# Patient Record
Sex: Female | Born: 1975 | Race: Asian | Hispanic: No | Marital: Married | Smoking: Never smoker
Health system: Southern US, Community
[De-identification: ages and names within clinical notes are randomized; demographics above are authoritative.]

## PROBLEM LIST (undated history)

## (undated) DIAGNOSIS — I10 Essential (primary) hypertension: Secondary | ICD-10-CM

## (undated) DIAGNOSIS — F419 Anxiety disorder, unspecified: Secondary | ICD-10-CM

## (undated) DIAGNOSIS — F32A Depression, unspecified: Secondary | ICD-10-CM

## (undated) DIAGNOSIS — R32 Unspecified urinary incontinence: Secondary | ICD-10-CM

## (undated) DIAGNOSIS — J302 Other seasonal allergic rhinitis: Secondary | ICD-10-CM

## (undated) HISTORY — DX: Unspecified urinary incontinence: R32

## (undated) HISTORY — PX: NO PAST SURGERIES: SHX2092

## (undated) HISTORY — DX: Depression, unspecified: F32.A

## (undated) HISTORY — DX: Other seasonal allergic rhinitis: J30.2

## (undated) HISTORY — DX: Anxiety disorder, unspecified: F41.9

## (undated) HISTORY — DX: Essential (primary) hypertension: I10

---

## 2001-06-10 ENCOUNTER — Encounter: Admission: RE | Admit: 2001-06-10 | Discharge: 2001-07-10 | Payer: Self-pay | Admitting: *Deleted

## 2001-07-11 ENCOUNTER — Encounter: Admission: RE | Admit: 2001-07-11 | Discharge: 2001-08-10 | Payer: Self-pay | Admitting: Unknown Physician Specialty

## 2001-09-10 ENCOUNTER — Encounter: Admission: RE | Admit: 2001-09-10 | Discharge: 2001-10-10 | Payer: Self-pay | Admitting: Unknown Physician Specialty

## 2005-11-29 ENCOUNTER — Other Ambulatory Visit: Admission: RE | Admit: 2005-11-29 | Discharge: 2005-11-29 | Payer: Self-pay | Admitting: Obstetrics & Gynecology

## 2006-12-12 ENCOUNTER — Other Ambulatory Visit: Admission: RE | Admit: 2006-12-12 | Discharge: 2006-12-12 | Payer: Self-pay | Admitting: Obstetrics & Gynecology

## 2008-04-07 ENCOUNTER — Other Ambulatory Visit: Admission: RE | Admit: 2008-04-07 | Discharge: 2008-04-07 | Payer: Self-pay | Admitting: Obstetrics & Gynecology

## 2015-02-06 ENCOUNTER — Ambulatory Visit (INDEPENDENT_AMBULATORY_CARE_PROVIDER_SITE_OTHER): Payer: 59 | Admitting: Nurse Practitioner

## 2015-02-06 ENCOUNTER — Encounter: Payer: Self-pay | Admitting: Nurse Practitioner

## 2015-02-06 ENCOUNTER — Other Ambulatory Visit (HOSPITAL_COMMUNITY)
Admission: RE | Admit: 2015-02-06 | Discharge: 2015-02-06 | Disposition: A | Discharge status: Home / Self Care | Payer: 59 | Source: Ambulatory Visit | Attending: Nurse Practitioner | Admitting: Nurse Practitioner

## 2015-02-06 VITALS — BP 132/84 | HR 86 | Temp 98.1°F | Ht 62.0 in | Wt 173.0 lb

## 2015-02-06 DIAGNOSIS — Z6831 Body mass index (BMI) 31.0-31.9, adult: Secondary | ICD-10-CM

## 2015-02-06 DIAGNOSIS — Z0000 Encounter for general adult medical examination without abnormal findings: Secondary | ICD-10-CM | POA: Diagnosis not present

## 2015-02-06 DIAGNOSIS — Z1151 Encounter for screening for human papillomavirus (HPV): Secondary | ICD-10-CM | POA: Insufficient documentation

## 2015-02-06 DIAGNOSIS — Z1239 Encounter for other screening for malignant neoplasm of breast: Secondary | ICD-10-CM | POA: Diagnosis not present

## 2015-02-06 DIAGNOSIS — Z124 Encounter for screening for malignant neoplasm of cervix: Secondary | ICD-10-CM | POA: Diagnosis not present

## 2015-02-06 DIAGNOSIS — E669 Obesity, unspecified: Secondary | ICD-10-CM | POA: Insufficient documentation

## 2015-02-06 DIAGNOSIS — Z01419 Encounter for gynecological examination (general) (routine) without abnormal findings: Secondary | ICD-10-CM | POA: Insufficient documentation

## 2015-02-06 DIAGNOSIS — Z113 Encounter for screening for infections with a predominantly sexual mode of transmission: Secondary | ICD-10-CM | POA: Insufficient documentation

## 2015-02-06 LAB — COMPREHENSIVE METABOLIC PANEL
ALK PHOS: 62 U/L (ref 39–117)
ALT: 26 U/L (ref 0–35)
AST: 23 U/L (ref 0–37)
Albumin: 4.4 g/dL (ref 3.5–5.2)
BUN: 11 mg/dL (ref 6–23)
CO2: 26 mEq/L (ref 19–32)
CREATININE: 0.68 mg/dL (ref 0.40–1.20)
Calcium: 9.7 mg/dL (ref 8.4–10.5)
Chloride: 104 mEq/L (ref 96–112)
GFR: 101.3 mL/min (ref >60.00)
Glucose, Bld: 99 mg/dL (ref 70–99)
POTASSIUM: 4 meq/L (ref 3.5–5.1)
Sodium: 137 mEq/L (ref 135–145)
Total Bilirubin: 0.5 mg/dL (ref 0.2–1.2)
Total Protein: 8 g/dL (ref 6.0–8.3)

## 2015-02-06 LAB — CBC WITH DIFFERENTIAL/PLATELET
Basophils Absolute: 0.1 10*3/uL (ref 0.0–0.1)
Basophils Relative: 1 % (ref 0.0–3.0)
Eosinophils Absolute: 0.3 10*3/uL (ref 0.0–0.7)
Eosinophils Relative: 2.9 % (ref 0.0–5.0)
HCT: 38.9 % (ref 36.0–46.0)
HEMOGLOBIN: 12.9 g/dL (ref 12.0–15.0)
LYMPHS ABS: 2.7 10*3/uL (ref 0.7–4.0)
Lymphocytes Relative: 25.6 % (ref 12.0–46.0)
MCHC: 33.3 g/dL (ref 30.0–36.0)
MCV: 88.2 fl (ref 78.0–100.0)
MONO ABS: 0.6 10*3/uL (ref 0.1–1.0)
Monocytes Relative: 5.4 % (ref 3.0–12.0)
Neutro Abs: 6.8 10*3/uL (ref 1.4–7.7)
Neutrophils Relative %: 65.1 % (ref 43.0–77.0)
PLATELETS: 406 10*3/uL — AB (ref 150.0–400.0)
RBC: 4.41 Mil/uL (ref 3.87–5.11)
RDW: 14.3 % (ref 11.5–15.5)
WBC: 10.4 10*3/uL (ref 4.0–10.5)

## 2015-02-06 LAB — LIPID PANEL
CHOLESTEROL: 176 mg/dL (ref 0–200)
HDL: 66.6 mg/dL (ref >39.00)
LDL CALC: 92 mg/dL (ref 0–99)
NonHDL: 109.4
TRIGLYCERIDES: 88 mg/dL (ref 0.0–149.0)
Total CHOL/HDL Ratio: 3
VLDL: 17.6 mg/dL (ref 0.0–40.0)

## 2015-02-06 LAB — TSH: TSH: 4.03 u[IU]/mL (ref 0.35–4.50)

## 2015-02-06 LAB — URINALYSIS, ROUTINE W REFLEX MICROSCOPIC
Bilirubin Urine: NEGATIVE
Hgb urine dipstick: NEGATIVE
KETONES UR: NEGATIVE
Leukocytes, UA: NEGATIVE
Nitrite: NEGATIVE
PH: 6 (ref 5.0–8.0)
RBC / HPF: NONE SEEN (ref 0–?)
Specific Gravity, Urine: <=1.005 — AB (ref 1.000–1.030)
TOTAL PROTEIN, URINE-UPE24: NEGATIVE
URINE GLUCOSE: NEGATIVE
Urobilinogen, UA: 0.2 (ref 0.0–1.0)
WBC, UA: NONE SEEN (ref 0–?)

## 2015-02-06 LAB — VITAMIN D 25 HYDROXY (VIT D DEFICIENCY, FRACTURES): VITD: 15.2 ng/mL — AB (ref 30.00–100.00)

## 2015-02-06 LAB — HEMOGLOBIN A1C: HEMOGLOBIN A1C: 5.5 % (ref 4.6–6.5)

## 2015-02-06 NOTE — Progress Notes (Signed)
Pre visit review using our clinic review tool, if applicable. No additional management support is needed unless otherwise documented below in the visit note. 

## 2015-02-06 NOTE — Patient Instructions (Addendum)
Our office will call you with lab results and any necessary follow up.  Weight loss goal is about 35 pounds. Allow at least 4 months for weight to come off-1 to 2 pounds per week. Develop lifelong habits of exercise most days of the week: take a 30 minute walk. The benefits include weight loss, lower risk for heart disease, diabetes, stroke, high blood pressure, lower rates of depression & dementia, better sleep quality & bone health.   Cut out refined sugar:anything that is sweet when you eat or drink it except fresh fruit. Cut out white bread, rolls, biscuits, bagels, muffins, pasta and cereals. Breads & cereals that have 4 gm or more of fiber per serving are good. Whole wheat pasta, brown rice and quinoa are good.  Consider reading Eat to Live by Excell Seltzer and begin implementing principles.  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 Let me know if you want additional nutrition counseling or help.  Pleasure to meet you!  Preventive Care for Adults, Female A healthy lifestyle and preventive care can promote health and wellness. Preventive health guidelines for women include the following key practices.  A routine yearly physical is a good way to check with your caregiver about your health and preventive screening. It is a chance to share any concerns and updates on your health, and to receive a thorough exam.  Visit your dentist for a routine exam and preventive care every 6 months. Brush your teeth twice a day and floss once a day. Good oral hygiene prevents tooth decay and gum disease.  The frequency of eye exams is based on your age, health, family medical history, use of contact lenses, and other factors. Follow your caregiver's recommendations for frequency of eye exams.  Eat a healthy diet. Foods like vegetables, fruits, whole grains, low-fat dairy products, and lean protein foods contain the nutrients you need without too many calories. Decrease your intake of foods high in solid fats, added  sugars, and salt. Eat the right amount of calories for you.Get information about a proper diet from your caregiver, if necessary.  Regular physical exercise is one of the most important things you can do for your health. Most adults should get at least 150 minutes of moderate-intensity exercise (any activity that increases your heart rate and causes you to sweat) each week. In addition, most adults need muscle-strengthening exercises on 2 or more days a week.  Maintain a healthy weight. The body mass index (BMI) is a screening tool to identify possible weight problems. It provides an estimate of body fat based on height and weight. Your caregiver can help determine your BMI, and can help you achieve or maintain a healthy weight.For adults 20 years and older:  A BMI below 18.5 is considered underweight.  A BMI of 18.5 to 24.9 is normal.  A BMI of 25 to 29.9 is considered overweight.  A BMI of 30 and above is considered obese.  Maintain normal blood lipids and cholesterol levels by exercising and minimizing your intake of saturated fat. Eat a balanced diet with plenty of fruit and vegetables. Blood tests for lipids and cholesterol should begin at age 70 and be repeated every 5 years. If your lipid or cholesterol levels are high, you are over 50, or you are at high risk for heart disease, you may need your cholesterol levels checked more frequently.Ongoing high lipid and cholesterol levels should be treated with medicines if diet and exercise are not effective.  If you smoke, find out from  your caregiver how to quit. If you do not use tobacco, do not start.  Lung cancer screening is recommended for adults aged 44 80 years who are at high risk for developing lung cancer because of a history of smoking. Yearly low-dose computed tomography (CT) is recommended for people who have at least a 30-pack-year history of smoking and are a current smoker or have quit within the past 15 years. A pack year of  smoking is smoking an average of 1 pack of cigarettes a day for 1 year (for example: 1 pack a day for 30 years or 2 packs a day for 15 years). Yearly screening should continue until the smoker has stopped smoking for at least 15 years. Yearly screening should also be stopped for people who develop a health problem that would prevent them from having lung cancer treatment.  If you are pregnant, do not drink alcohol. If you are breastfeeding, be very cautious about drinking alcohol. If you are not pregnant and choose to drink alcohol, do not exceed 1 drink per day. One drink is considered to be 12 ounces (355 mL) of beer, 5 ounces (148 mL) of wine, or 1.5 ounces (44 mL) of liquor.  Avoid use of street drugs. Do not share needles with anyone. Ask for help if you need support or instructions about stopping the use of drugs.  High blood pressure causes heart disease and increases the risk of stroke. Your blood pressure should be checked at least every 1 to 2 years. Ongoing high blood pressure should be treated with medicines if weight loss and exercise are not effective.  If you are 17 to 41 years old, ask your caregiver if you should take aspirin to prevent strokes.  Diabetes screening involves taking a blood sample to check your fasting blood sugar level. This should be done once every 3 years, after age 27, if you are within normal weight and without risk factors for diabetes. Testing should be considered at a younger age or be carried out more frequently if you are overweight and have at least 1 risk factor for diabetes.  Breast cancer screening is essential preventive care for women. You should practice "breast self-awareness." This means understanding the normal appearance and feel of your breasts and may include breast self-examination. Any changes detected, no matter how small, should be reported to a caregiver. Women in their 7s and 30s should have a clinical breast exam (CBE) by a caregiver as part  of a regular health exam every 1 to 3 years. After age 38, women should have a CBE every year. Starting at age 23, women should consider having a mammography (breast X-ray test) every year. Women who have a family history of breast cancer should talk to their caregiver about genetic screening. Women at a high risk of breast cancer should talk to their caregivers about having magnetic resonance imaging (MRI) and a mammography every year.  Breast cancer gene (BRCA)-related cancer risk assessment is recommended for women who have family members with BRCA-related cancers. BRCA-related cancers include breast, ovarian, tubal, and peritoneal cancers. Having family members with these cancers may be associated with an increased risk for harmful changes (mutations) in the breast cancer genes BRCA1 and BRCA2. Results of the assessment will determine the need for genetic counseling and BRCA1 and BRCA2 testing.  The Pap test is a screening test for cervical cancer. A Pap test can show cell changes on the cervix that might become cervical cancer if left untreated. A  Pap test is a procedure in which cells are obtained and examined from the lower end of the uterus (cervix).  Women should have a Pap test starting at age 54.  Between ages 49 and 25, Pap tests should be repeated every 2 years.  Beginning at age 41, you should have a Pap test every 3 years as long as the past 3 Pap tests have been normal.  Some women have medical problems that increase the chance of getting cervical cancer. Talk to your caregiver about these problems. It is especially important to talk to your caregiver if a new problem develops soon after your last Pap test. In these cases, your caregiver may recommend more frequent screening and Pap tests.  The above recommendations are the same for women who have or have not gotten the vaccine for human papillomavirus (HPV).  If you had a hysterectomy for a problem that was not cancer or a condition  that could lead to cancer, then you no longer need Pap tests. Even if you no longer need a Pap test, a regular exam is a good idea to make sure no other problems are starting.  If you are between ages 54 and 39, and you have had normal Pap tests going back 10 years, you no longer need Pap tests. Even if you no longer need a Pap test, a regular exam is a good idea to make sure no other problems are starting.  If you have had past treatment for cervical cancer or a condition that could lead to cancer, you need Pap tests and screening for cancer for at least 20 years after your treatment.  If Pap tests have been discontinued, risk factors (such as a new sexual partner) need to be reassessed to determine if screening should be resumed.  The HPV test is an additional test that may be used for cervical cancer screening. The HPV test looks for the virus that can cause the cell changes on the cervix. The cells collected during the Pap test can be tested for HPV. The HPV test could be used to screen women aged 37 years and older, and should be used in women of any age who have unclear Pap test results. After the age of 74, women should have HPV testing at the same frequency as a Pap test.  Colorectal cancer can be detected and often prevented. Most routine colorectal cancer screening begins at the age of 58 and continues through age 68. However, your caregiver may recommend screening at an earlier age if you have risk factors for colon cancer. On a yearly basis, your caregiver may provide home test kits to check for hidden blood in the stool. Use of a small camera at the end of a tube, to directly examine the colon (sigmoidoscopy or colonoscopy), can detect the earliest forms of colorectal cancer. Talk to your caregiver about this at age 24, when routine screening begins. Direct examination of the colon should be repeated every 5 to 10 years through age 76, unless early forms of pre-cancerous polyps or small  growths are found.  Hepatitis C blood testing is recommended for all people born from [birth year] through 1967 and any individual with known risks for hepatitis C.  Practice safe sex. Use condoms and avoid high-risk sexual practices to reduce the spread of sexually transmitted infections (STIs). STIs include gonorrhea, chlamydia, syphilis, trichomonas, herpes, HPV, and human immunodeficiency virus (HIV). Herpes, HIV, and HPV are viral illnesses that have no cure. They can result  in disability, cancer, and death. Sexually active women aged 89 and younger should be checked for chlamydia. Older women with new or multiple partners should also be tested for chlamydia. Testing for other STIs is recommended if you are sexually active and at increased risk.  Osteoporosis is a disease in which the bones lose minerals and strength with aging. This can result in serious bone fractures. The risk of osteoporosis can be identified using a bone density scan. Women ages 56 and over and women at risk for fractures or osteoporosis should discuss screening with their caregivers. Ask your caregiver whether you should take a calcium supplement or vitamin D to reduce the rate of osteoporosis.  Menopause can be associated with physical symptoms and risks. Hormone replacement therapy is available to decrease symptoms and risks. You should talk to your caregiver about whether hormone replacement therapy is right for you.  Use sunscreen. Apply sunscreen liberally and repeatedly throughout the day. You should seek shade when your shadow is shorter than you. Protect yourself by wearing long sleeves, pants, a wide-brimmed hat, and sunglasses year round, whenever you are outdoors.  Once a month, do a whole body skin exam, using a mirror to look at the skin on your back. Notify your caregiver of new moles, moles that have irregular borders, moles that are larger than a pencil eraser, or moles that have changed in shape or color.  Stay  current with required immunizations.  Influenza vaccine. All adults should be immunized every year.  Tetanus, diphtheria, and acellular pertussis (Td, Tdap) vaccine. Pregnant women should receive 1 dose of Tdap vaccine during each pregnancy. The dose should be obtained regardless of the length of time since the last dose. Immunization is preferred during the 27th to 36th week of gestation. An adult who has not previously received Tdap or who does not know her vaccine status should receive 1 dose of Tdap. This initial dose should be followed by tetanus and diphtheria toxoids (Td) booster doses every 10 years. Adults with an unknown or incomplete history of completing a 3-dose immunization series with Td-containing vaccines should begin or complete a primary immunization series including a Tdap dose. Adults should receive a Td booster every 10 years.  Varicella vaccine. An adult without evidence of immunity to varicella should receive 2 doses or a second dose if she has previously received 1 dose. Pregnant females who do not have evidence of immunity should receive the first dose after pregnancy. This first dose should be obtained before leaving the health care facility. The second dose should be obtained 4 8 weeks after the first dose.  Human papillomavirus (HPV) vaccine. Females aged 71 26 years who have not received the vaccine previously should obtain the 3-dose series. The vaccine is not recommended for use in pregnant females. However, pregnancy testing is not needed before receiving a dose. If a female is found to be pregnant after receiving a dose, no treatment is needed. In that case, the remaining doses should be delayed until after the pregnancy. Immunization is recommended for any person with an immunocompromised condition through the age of 87 years if she did not get any or all doses earlier. During the 3-dose series, the second dose should be obtained 4 8 weeks after the first dose. The third  dose should be obtained 24 weeks after the first dose and 16 weeks after the second dose.  Zoster vaccine. One dose is recommended for adults aged 61 years or older unless certain conditions are  present.  Measles, mumps, and rubella (MMR) vaccine. Adults born before [birth year] generally are considered immune to measles and mumps. Adults born in [birth year] or later should have 1 or more doses of MMR vaccine unless there is a contraindication to the vaccine or there is laboratory evidence of immunity to each of the three diseases. A routine second dose of MMR vaccine should be obtained at least 28 days after the first dose for students attending postsecondary schools, health care workers, or international travelers. People who received inactivated measles vaccine or an unknown type of measles vaccine during 1963 1967 should receive 2 doses of MMR vaccine. People who received inactivated mumps vaccine or an unknown type of mumps vaccine before 1979 and are at high risk for mumps infection should consider immunization with 2 doses of MMR vaccine. For females of childbearing age, rubella immunity should be determined. If there is no evidence of immunity, females who are not pregnant should be vaccinated. If there is no evidence of immunity, females who are pregnant should delay immunization until after pregnancy. Unvaccinated health care workers born before [birth year] who lack laboratory evidence of measles, mumps, or rubella immunity or laboratory confirmation of disease should consider measles and mumps immunization with 2 doses of MMR vaccine or rubella immunization with 1 dose of MMR vaccine.  Pneumococcal 13-valent conjugate (PCV13) vaccine. When indicated, a person who is uncertain of her immunization history and has no record of immunization should receive the PCV13 vaccine. An adult aged 28 years or older who has certain medical conditions and has not been previously immunized should receive 1 dose of PCV13 vaccine. This  PCV13 should be followed with a dose of pneumococcal polysaccharide (PPSV23) vaccine. The PPSV23 vaccine dose should be obtained at least 8 weeks after the dose of PCV13 vaccine. An adult aged 62 years or older who has certain medical conditions and previously received 1 or more doses of PPSV23 vaccine should receive 1 dose of PCV13. The PCV13 vaccine dose should be obtained 1 or more years after the last PPSV23 vaccine dose.  Pneumococcal polysaccharide (PPSV23) vaccine. When PCV13 is also indicated, PCV13 should be obtained first. All adults aged 27 years and older should be immunized. An adult younger than age 39 years who has certain medical conditions should be immunized. Any person who resides in a nursing home or long-term care facility should be immunized. An adult smoker should be immunized. People with an immunocompromised condition and certain other conditions should receive both PCV13 and PPSV23 vaccines. People with human immunodeficiency virus (HIV) infection should be immunized as soon as possible after diagnosis. Immunization during chemotherapy or radiation therapy should be avoided. Routine use of PPSV23 vaccine is not recommended for American Indians, [place] Natives, or people younger than 65 years unless there are medical conditions that require PPSV23 vaccine. When indicated, people who have unknown immunization and have no record of immunization should receive PPSV23 vaccine. One-time revaccination 5 years after the first dose of PPSV23 is recommended for people aged 25 64 years who have chronic kidney failure, nephrotic syndrome, asplenia, or immunocompromised conditions. People who received 1 2 doses of PPSV23 before age 70 years should receive another dose of PPSV23 vaccine at age 3 years or later if at least 5 years have passed since the previous dose. Doses of PPSV23 are not needed for people immunized with PPSV23 at or after age 23 years.  Meningococcal vaccine. Adults with asplenia  or persistent complement component deficiencies should receive 2 doses of  quadrivalent meningococcal conjugate (MenACWY-D) vaccine. The doses should be obtained at least 2 months apart. Microbiologists working with certain meningococcal bacteria, [employer] recruits, people at risk during an outbreak, and people who travel to or live in countries with a high rate of meningitis should be immunized. A first-year college student up through age 27 years who is living in a residence hall should receive a dose if she did not receive a dose on or after her 16th birthday. Adults who have certain high-risk conditions should receive one or more doses of vaccine.  Hepatitis A vaccine. Adults who wish to be protected from this disease, have certain high-risk conditions, work with hepatitis A-infected animals, work in hepatitis A research labs, or travel to or work in countries with a high rate of hepatitis A should be immunized. Adults who were previously unvaccinated and who anticipate close contact with an international adoptee during the first 60 days after arrival in the [place] from a country with a high rate of hepatitis A should be immunized.  Hepatitis B vaccine. Adults who wish to be protected from this disease, have certain high-risk conditions, may be exposed to blood or other infectious body fluids, are household contacts or sex partners of hepatitis B positive people, are clients or workers in certain care facilities, or travel to or work in countries with a high rate of hepatitis B should be immunized.  Haemophilus influenzae type b (Hib) vaccine. A previously unvaccinated person with asplenia or sickle cell disease or having a scheduled splenectomy should receive 1 dose of Hib vaccine. Regardless of previous immunization, a recipient of a hematopoietic stem cell transplant should receive a 3-dose series 6 12 months after her successful transplant. Hib vaccine is not recommended for adults with HIV  infection. Preventive Services / Frequency Ages 49 to 58  Blood pressure check.** / Every 1 to 2 years.  Lipid and cholesterol check.** / Every 5 years beginning at age 2.  Clinical breast exam.** / Every 3 years for women in their 50s and 75s.  BRCA-related cancer risk assessment.** / For women who have family members with a BRCA-related cancer (breast, ovarian, tubal, or peritoneal cancers).  Pap test.** / Every 2 years from ages 62 through 52. Every 3 years starting at age 35 through age 46 or 77 with a history of 3 consecutive normal Pap tests.  HPV screening.** / Every 3 years from ages 31 through ages 58 to 38 with a history of 3 consecutive normal Pap tests.  Hepatitis C blood test.** / For any individual with known risks for hepatitis C.  Skin self-exam. / Monthly.  Influenza vaccine. / Every year.  Tetanus, diphtheria, and acellular pertussis (Tdap, Td) vaccine.** / Consult your caregiver. Pregnant women should receive 1 dose of Tdap vaccine during each pregnancy. 1 dose of Td every 10 years.  Varicella vaccine.** / Consult your caregiver. Pregnant females who do not have evidence of immunity should receive the first dose after pregnancy.  HPV vaccine. / 3 doses over 6 months, if 55 and younger. The vaccine is not recommended for use in pregnant females. However, pregnancy testing is not needed before receiving a dose.  Measles, mumps, rubella (MMR) vaccine.** / You need at least 1 dose of MMR if you were born in 1957 or later. You may also need a 2nd dose. For females of childbearing age, rubella immunity should be determined. If there is no evidence of immunity, females who are not pregnant should be vaccinated. If there  is no evidence of immunity, females who are pregnant should delay immunization until after pregnancy.  Pneumococcal 13-valent conjugate (PCV13) vaccine.** / Consult your caregiver.  Pneumococcal polysaccharide (PPSV23) vaccine.** / 1 to 2 doses if you  smoke cigarettes or if you have certain conditions.  Meningococcal vaccine.** / 1 dose if you are age 3 to 57 years and a Market researcher living in a residence hall, or have one of several medical conditions, you need to get vaccinated against meningococcal disease. You may also need additional booster doses.  Hepatitis A vaccine.** / Consult your caregiver.  Hepatitis B vaccine.** / Consult your caregiver.  Haemophilus influenzae type b (Hib) vaccine.** / Consult your caregiver. Ages 2 to 84  Blood pressure check.** / Every 1 to 2 years.  Lipid and cholesterol check.** / Every 5 years beginning at age 9.  Lung cancer screening. / Every year if you are aged 83 80 years and have a 30-pack-year history of smoking and currently smoke or have quit within the past 15 years. Yearly screening is stopped once you have quit smoking for at least 15 years or develop a health problem that would prevent you from having lung cancer treatment.  Clinical breast exam.** / Every year after age 64.  BRCA-related cancer risk assessment.** / For women who have family members with a BRCA-related cancer (breast, ovarian, tubal, or peritoneal cancers).  Mammogram.** / Every year beginning at age 26 and continuing for as long as you are in good health. Consult with your caregiver.  Pap test.** / Every 3 years starting at age 34 through age 28 or 87 with a history of 3 consecutive normal Pap tests.  HPV screening.** / Every 3 years from ages 81 through ages 68 to 33 with a history of 3 consecutive normal Pap tests.  Fecal occult blood test (FOBT) of stool. / Every year beginning at age 75 and continuing until age 65. You may not need to do this test if you get a colonoscopy every 10 years.  Flexible sigmoidoscopy or colonoscopy.** / Every 5 years for a flexible sigmoidoscopy or every 10 years for a colonoscopy beginning at age 56 and continuing until age 18.  Hepatitis C blood test.** / For all  people born from [birth year] through 1967 and any individual with known risks for hepatitis C.  Skin self-exam. / Monthly.  Influenza vaccine. / Every year.  Tetanus, diphtheria, and acellular pertussis (Tdap/Td) vaccine.** / Consult your caregiver. Pregnant women should receive 1 dose of Tdap vaccine during each pregnancy. 1 dose of Td every 10 years.  Varicella vaccine.** / Consult your caregiver. Pregnant females who do not have evidence of immunity should receive the first dose after pregnancy.  Zoster vaccine.** / 1 dose for adults aged 78 years or older.  Measles, mumps, rubella (MMR) vaccine.** / You need at least 1 dose of MMR if you were born in 1957 or later. You may also need a 2nd dose. For females of childbearing age, rubella immunity should be determined. If there is no evidence of immunity, females who are not pregnant should be vaccinated. If there is no evidence of immunity, females who are pregnant should delay immunization until after pregnancy.  Pneumococcal 13-valent conjugate (PCV13) vaccine.** / Consult your caregiver.  Pneumococcal polysaccharide (PPSV23) vaccine.** / 1 to 2 doses if you smoke cigarettes or if you have certain conditions.  Meningococcal vaccine.** / Consult your caregiver.  Hepatitis A vaccine.** / Consult your caregiver.  Hepatitis B vaccine.** /  Consult your caregiver.  Haemophilus influenzae type b (Hib) vaccine.** / Consult your caregiver. Ages 46 and over  Blood pressure check.** / Every 1 to 2 years.  Lipid and cholesterol check.** / Every 5 years beginning at age 17.  Lung cancer screening. / Every year if you are aged 74 80 years and have a 30-pack-year history of smoking and currently smoke or have quit within the past 15 years. Yearly screening is stopped once you have quit smoking for at least 15 years or develop a health problem that would prevent you from having lung cancer treatment.  Clinical breast exam.** / Every year after age  83.  BRCA-related cancer risk assessment.** / For women who have family members with a BRCA-related cancer (breast, ovarian, tubal, or peritoneal cancers).  Mammogram.** / Every year beginning at age 40 and continuing for as long as you are in good health. Consult with your caregiver.  Pap test.** / Every 3 years starting at age 32 through age 30 or 17 with a 3 consecutive normal Pap tests. Testing can be stopped between 65 and 70 with 3 consecutive normal Pap tests and no abnormal Pap or HPV tests in the past 10 years.  HPV screening.** / Every 3 years from ages 57 through ages 48 or 78 with a history of 3 consecutive normal Pap tests. Testing can be stopped between 65 and 70 with 3 consecutive normal Pap tests and no abnormal Pap or HPV tests in the past 10 years.  Fecal occult blood test (FOBT) of stool. / Every year beginning at age 73 and continuing until age 10. You may not need to do this test if you get a colonoscopy every 10 years.  Flexible sigmoidoscopy or colonoscopy.** / Every 5 years for a flexible sigmoidoscopy or every 10 years for a colonoscopy beginning at age 46 and continuing until age 60.  Hepatitis C blood test.** / For all people born from [birth year] through 1967 and any individual with known risks for hepatitis C.  Osteoporosis screening.** / A one-time screening for women ages 71 and over and women at risk for fractures or osteoporosis.  Skin self-exam. / Monthly.  Influenza vaccine. / Every year.  Tetanus, diphtheria, and acellular pertussis (Tdap/Td) vaccine.** / 1 dose of Td every 10 years.  Varicella vaccine.** / Consult your caregiver.  Zoster vaccine.** / 1 dose for adults aged 2 years or older.  Pneumococcal 13-valent conjugate (PCV13) vaccine.** / Consult your caregiver.  Pneumococcal polysaccharide (PPSV23) vaccine.** / 1 dose for all adults aged 70 years and older.  Meningococcal vaccine.** / Consult your caregiver.  Hepatitis A vaccine.** / Consult  your caregiver.  Hepatitis B vaccine.** / Consult your caregiver.  Haemophilus influenzae type b (Hib) vaccine.** / Consult your caregiver. ** Family history and personal history of risk and conditions may change your caregiver's recommendations. Document Released: 12/17/2001 Document Revised: 02/15/2013 Document Reviewed: 03/18/2011 [facility] Patient Information 2014 [place], [place].

## 2015-02-06 NOTE — Progress Notes (Signed)
Subjective:     Kayla Reeves is a 39 y.o. female and is here for a comprehensive physical exam. The patient reports no problems. Historical care at [facility] family physicians. Seen 4 yrs ago. She reports 35 lb wt gain over last year-"stress eating". Mother passed this year. She is working with Physiological scientist 3 days week. Last pap 2008. No abnmls. Never had MMG. Vaccines UTD.  History   Social History  . Marital Status: Married    Spouse Name: N/A  . Number of Children: 3  . Years of Education: [employer]   Occupational History  . [employer]    Social History Main Topics  . Smoking status: Never Smoker   . Smokeless tobacco: Not on file  . Alcohol Use: 0.6 oz/week    0 Standard drinks or equivalent, 1 Glasses of wine per week  . Drug Use: No  . Sexual Activity: Yes    Birth Control/ Protection: Surgical   Other Topics Concern  . Not on file   Social History Narrative   Kayla Reeves lives with husband & 3 kids. 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   Health Maintenance  Topic Date Due  . HIV Screening  01/06/1989  . PAP SMEAR  01/07/1992  . INFLUENZA VACCINE  06/05/2015  . TETANUS/TDAP  02/05/2017    The following portions of the patient's history were reviewed and updated as appropriate: allergies, current medications, past family history, past medical history, past social history, past surgical history and problem list.  Review of Systems Constitutional: negative for fatigue and fevers Eyes: positive for contacts/glasses Respiratory: negative for cough and wheezing Cardiovascular: negative for lower extremity edema and palpitations Gastrointestinal: negative for constipation, diarrhea and reflux symptoms Genitourinary:negative for abnormal menstrual periods Integument/breast: negative for rash Musculoskeletal:negative for arthralgias Allergic/Immunologic: positive for hay fever   Objective:    BP 132/84 mmHg  Pulse 86  Temp(Src) 98.1 F (36.7 C) (Oral)  Ht 5' 2"  (1.575 m)  Wt  173 lb (78.472 kg)  BMI 31.63 kg/m2  SpO2 99%  LMP 01/29/2015 General appearance: alert, cooperative, appears stated age and no distress Head: Normocephalic, without obvious abnormality, atraumatic Eyes: negative findings: lids and lashes normal, conjunctivae and sclerae normal, corneas clear, pupils equal, round, reactive to light and accomodation, visual fields full to confrontation and wearing glasses Ears: normal TM's and external ear canals both ears Throat: lips, mucosa, and tongue normal; teeth and gums normal Neck: no adenopathy, no carotid bruit, supple, symmetrical, trachea midline and thyroid not enlarged, symmetric, no tenderness/mass/nodules Lungs: clear to auscultation bilaterally Breasts: normal appearance, no masses or tenderness Heart: regular rate and rhythm, S1, S2 normal, no murmur, click, rub or gallop Abdomen: soft, non-tender; bowel sounds normal; no masses,  no organomegaly Pelvic: adnexae not palpable, cervix normal in appearance, external genitalia normal, no adnexal masses or tenderness, no cervical motion tenderness, uterus normal size, shape, and consistency and vagina normal without discharge Extremities: extremities normal, atraumatic, no cyanosis or edema Pulses: 2+ and symmetric Lymph nodes: Cervical, supraclavicular, and axillary nodes normal. Neurologic: Grossly normal    Assessment: Plan     1. Preventative health care - CBC with Differential/Platelet - Comprehensive metabolic panel - Vit D  25 hydroxy (rtn osteoporosis monitoring) - TSH - Hemoglobin A1c - Lipid panel - Urinalysis, Routine w reflex microscopic - HIV antibody  2. Cervical cancer screening - Cytology - PAP  3. Breast cancer screening - MM DIGITAL SCREENING BILATERAL; Future  4. BMI 31.0-31.9,adult Discussed increasing activity, diet changes  Continue wotking with trainer. Wt loss goal is 35 lbs. F/u 1-2 yrs, sooner if concerns or need additional help w/wt loss.

## 2015-02-07 LAB — HIV ANTIBODY (ROUTINE TESTING W REFLEX): HIV 1&2 Ab, 4th Generation: NONREACTIVE

## 2015-02-08 LAB — CYTOLOGY - PAP

## 2015-02-09 ENCOUNTER — Telehealth: Payer: Self-pay | Admitting: Nurse Practitioner

## 2015-02-09 DIAGNOSIS — R7989 Other specified abnormal findings of blood chemistry: Secondary | ICD-10-CM

## 2015-02-09 DIAGNOSIS — E559 Vitamin D deficiency, unspecified: Secondary | ICD-10-CM

## 2015-02-09 MED ORDER — VITAMIN D3 1.25 MG (50000 UT) PO CAPS: 1.0000 | ORAL_CAPSULE | ORAL | Status: DC

## 2015-02-09 NOTE — Telephone Encounter (Signed)
Called and informed patient of lab results. Lab appt made.

## 2015-02-09 NOTE — Telephone Encounter (Signed)
pls call pt: Advise Labs look good-just 2 concerns: vit d too low, sent script. Thyroid is borderline. i want to check again in 12 weeks. Pls schedule lab appt. Pap neg, HPV neg. Recommend pap again in 5 yrs.

## 2015-04-17 ENCOUNTER — Other Ambulatory Visit: Payer: 59

## 2015-07-05 ENCOUNTER — Encounter: Payer: Self-pay | Admitting: Family Medicine

## 2015-07-05 ENCOUNTER — Ambulatory Visit (INDEPENDENT_AMBULATORY_CARE_PROVIDER_SITE_OTHER): Payer: 59 | Admitting: Family Medicine

## 2015-07-05 ENCOUNTER — Telehealth: Payer: Self-pay | Admitting: Family Medicine

## 2015-07-05 VITALS — BP 154/98 | HR 88 | Temp 98.0°F | Wt 175.0 lb

## 2015-07-05 DIAGNOSIS — J0100 Acute maxillary sinusitis, unspecified: Secondary | ICD-10-CM | POA: Diagnosis not present

## 2015-07-05 MED ORDER — FLUTICASONE PROPIONATE 50 MCG/ACT NA SUSP: 2.0000 | Freq: Every day | NASAL | Status: DC

## 2015-07-05 MED ORDER — AMOXICILLIN-POT CLAVULANATE 875-125 MG PO TABS: 1.0000 | ORAL_TABLET | Freq: Two times a day (BID) | ORAL | Status: DC

## 2015-07-05 MED ORDER — GUAIFENESIN-DM 100-10 MG/5ML PO SYRP: 10.0000 mL | ORAL_SOLUTION | ORAL | Status: DC | PRN

## 2015-07-05 NOTE — Patient Instructions (Signed)

## 2015-07-05 NOTE — Progress Notes (Signed)
   Subjective:    Patient ID: Kayla Reeves, female    DOB: [birth year], 39 y.o.   MRN: [id number]  HPI  Sore throat: Pt reports to acute visit for sore throat, rhinorrhea that has been present for 2.5 weeks. Patient states her family was ill about 3 weeks with similar symptoms and she felt she was as well. She states, her symptoms started to improve when she wen ton vacation, but then worsened again over the last few days. She endorses productive cough (clear), unable to sleep d/t night time cough, headache, rhinorrhea, post nasal drip. She denies nausea, vomit, rash, diarrhea or congestion. She has tried ibuprofen for initial fever (2 weeks ago- no current fever or chills) and sore throat. She has also tried vis vapor rub and onion poultice.    Past Medical History  Diagnosis Date  . Seasonal allergies   . Urinary tract infection   . Allergy    No Known Allergies No past surgical history on file. Social History   Social History  . Marital Status: Married    Spouse Name: N/A  . Number of Children: 3  . Years of Education: [employer]   Occupational History  . [employer]    Social History Main Topics  . Smoking status: Never Smoker   . Smokeless tobacco: Not on file  . Alcohol Use: 0.6 oz/week    0 Standard drinks or equivalent, 1 Glasses of wine per week  . Drug Use: No  . Sexual Activity: Yes    Birth Control/ Protection: Surgical   Other Topics Concern  . Not on file   Social History Narrative   Ms Rhames lives with husband & 3 kids. 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   Review of Systems Negative, with the exception of above mentioned in HPI    Objective:   Physical Exam BP 154/98 mmHg  Pulse 88  Temp(Src) 98 F (36.7 C) (Oral)  Wt 175 lb (79.379 kg)  SpO2 99%  LMP 06/19/2015 Gen: Afebrile. No acute distress. Nontoxic in appearance. Appears fatigued. Moderate cough present during exam.  HENT: AT. [place]. Bilateral TM visualized and normal in appearance.  MMM. Bilateral nares with  erythema and swelling. Throat without erythema or exudates. Mild hoarseness on exam. Mild facial pressure.  Eyes:Pupils Equal Round Reactive to light,  Conjunctiva without redness, discharge or icterus. Neck: Supple,mild cervical anterior lymphadenopathy CV: RRR  Chest: CTAB, no wheeze or crackles. Good air movement Skin: No rashes, purpura or petechiae.      Assessment & Plan:  1. Acute maxillary sinusitis, recurrence not specified - signs/sx of sinusitis on exam today.  - amoxicillin-clavulanate (AUGMENTIN) 875-125 MG per tablet; Take 1 tablet by mouth 2 (two) times daily.  Dispense: 20 tablet; Refill: 0 - fluticasone (FLONASE) 50 MCG/ACT nasal spray; Place 2 sprays into both nostrils daily.  Dispense: 16 g; Refill: 6 - guaiFENesin-dextromethorphan (ROBITUSSIN DM) 100-10 MG/5ML syrup; Take 10 mLs by mouth every 4 (four) hours as needed for cough.  Dispense: 118 mL; Refill: 0 - Pt to return in not improved in 1 week. Discussed cough my linger even after illness has resolved for a few weeks.

## 2015-07-05 NOTE — Telephone Encounter (Signed)
[facility] called and said recent RX called in today for [place] is a failed rx? Please call at [phone] to verify.

## 2015-07-05 NOTE — Telephone Encounter (Signed)
Called Rx in for Robitussin DM as ordered at today's office visit.

## 2016-11-01 ENCOUNTER — Encounter: Payer: Self-pay | Admitting: Family Medicine

## 2016-11-01 ENCOUNTER — Ambulatory Visit (INDEPENDENT_AMBULATORY_CARE_PROVIDER_SITE_OTHER): Payer: 59 | Admitting: Family Medicine

## 2016-11-01 VITALS — BP 123/86 | HR 94 | Temp 98.4°F | Resp 20 | Ht 62.0 in | Wt 167.8 lb

## 2016-11-01 DIAGNOSIS — Z0000 Encounter for general adult medical examination without abnormal findings: Secondary | ICD-10-CM

## 2016-11-01 DIAGNOSIS — E559 Vitamin D deficiency, unspecified: Secondary | ICD-10-CM | POA: Diagnosis not present

## 2016-11-01 DIAGNOSIS — E669 Obesity, unspecified: Secondary | ICD-10-CM | POA: Diagnosis not present

## 2016-11-01 DIAGNOSIS — Z0189 Encounter for other specified special examinations: Secondary | ICD-10-CM

## 2016-11-01 DIAGNOSIS — Z1239 Encounter for other screening for malignant neoplasm of breast: Secondary | ICD-10-CM

## 2016-11-01 DIAGNOSIS — L821 Other seborrheic keratosis: Secondary | ICD-10-CM

## 2016-11-01 DIAGNOSIS — Z6830 Body mass index (BMI) 30.0-30.9, adult: Secondary | ICD-10-CM

## 2016-11-01 DIAGNOSIS — Z130 Encounter for screening for diseases of the blood and blood-forming organs and certain disorders involving the immune mechanism: Secondary | ICD-10-CM

## 2016-11-01 DIAGNOSIS — Z1231 Encounter for screening mammogram for malignant neoplasm of breast: Secondary | ICD-10-CM

## 2016-11-01 LAB — LIPID PANEL
CHOLESTEROL: 156 mg/dL (ref 0–200)
HDL: 62.8 mg/dL (ref >39.00)
LDL Cholesterol: 76 mg/dL (ref 0–99)
NonHDL: 93.57
TRIGLYCERIDES: 88 mg/dL (ref 0.0–149.0)
Total CHOL/HDL Ratio: 2
VLDL: 17.6 mg/dL (ref 0.0–40.0)

## 2016-11-01 LAB — COMPREHENSIVE METABOLIC PANEL
ALBUMIN: 4.2 g/dL (ref 3.5–5.2)
ALT: 17 U/L (ref 0–35)
AST: 16 U/L (ref 0–37)
Alkaline Phosphatase: 56 U/L (ref 39–117)
BILIRUBIN TOTAL: 0.6 mg/dL (ref 0.2–1.2)
BUN: 13 mg/dL (ref 6–23)
CALCIUM: 9 mg/dL (ref 8.4–10.5)
CO2: 28 meq/L (ref 19–32)
CREATININE: 0.69 mg/dL (ref 0.40–1.20)
Chloride: 102 mEq/L (ref 96–112)
GFR: 98.78 mL/min (ref >60.00)
Glucose, Bld: 95 mg/dL (ref 70–99)
Potassium: 4.1 mEq/L (ref 3.5–5.1)
SODIUM: 138 meq/L (ref 135–145)
Total Protein: 7.3 g/dL (ref 6.0–8.3)

## 2016-11-01 LAB — CBC WITH DIFFERENTIAL/PLATELET
BASOS ABS: 0 10*3/uL (ref 0.0–0.1)
Basophils Relative: 0.5 % (ref 0.0–3.0)
Eosinophils Absolute: 0.3 10*3/uL (ref 0.0–0.7)
Eosinophils Relative: 3.1 % (ref 0.0–5.0)
HEMATOCRIT: 36.7 % (ref 36.0–46.0)
Hemoglobin: 12.4 g/dL (ref 12.0–15.0)
LYMPHS PCT: 19.6 % (ref 12.0–46.0)
Lymphs Abs: 2 10*3/uL (ref 0.7–4.0)
MCHC: 33.8 g/dL (ref 30.0–36.0)
MCV: 87.6 fl (ref 78.0–100.0)
MONO ABS: 0.7 10*3/uL (ref 0.1–1.0)
Monocytes Relative: 6.6 % (ref 3.0–12.0)
Neutro Abs: 7 10*3/uL (ref 1.4–7.7)
Neutrophils Relative %: 70.2 % (ref 43.0–77.0)
PLATELETS: 353 10*3/uL (ref 150.0–400.0)
RBC: 4.2 Mil/uL (ref 3.87–5.11)
RDW: 14.5 % (ref 11.5–15.5)
WBC: 10 10*3/uL (ref 4.0–10.5)

## 2016-11-01 LAB — TSH: TSH: 3.63 u[IU]/mL (ref 0.35–4.50)

## 2016-11-01 LAB — HEMOGLOBIN A1C: Hgb A1c MFr Bld: 5.2 % (ref 4.6–6.5)

## 2016-11-01 LAB — VITAMIN D 25 HYDROXY (VIT D DEFICIENCY, FRACTURES): VITD: 18.78 ng/mL — ABNORMAL LOW (ref 30.00–100.00)

## 2016-11-01 NOTE — Patient Instructions (Signed)
It was a pleasure seeing you again.   Please help [place] help you:  It is a privilege to be able to take care of great patients such as yourself. We are honored you have chosen [facility] for your Primary Care home. Below you will find basic instructions that you may need to access in the future. Please help [place] help you by reading the instructions, which cover many of the frequent questions we experience.   Prescription refills and request:  -In order to allow more efficient response time, please call your pharmacy for all refills. They will forward the request electronically to [place]. This allows for the quickest possible response. Request left on a nurse line can take longer to refill, since these are checked as time allows between office patients and other phone calls.  - refill request can take up to 3-5 working days to complete.  - If request is sent electronically and request is appropiate, it is usually completed in 1-2 business days.  - all patients will need to be seen routinely for all chronic medical conditions requiring prescription medications (see follow-up below). If you are overdue for follow up on your condition, you will be asked to make an appointment and we will call in enough medication to cover you until your appointment (up to 30 days).  - all controlled substances will require a face to face visit to request/refill.  - if you desire your prescriptions to go through a new pharmacy, and have an active script at original pharmacy, you will need to call your pharmacy and have scripts transferred to new pharmacy. This is completed between the pharmacy locations and not by your provider.    Results: If any images or labs were ordered, it can take up to 1 week to get results depending on the test ordered and the lab/facility running and resulting the test. - Normal or stable results, which do not need further discussion, will be released to your mychart immediately with attached note  to you. A call will not be generated for normal results. Please make certain to sign up for mychart. If you have questions on how to activate your mychart you can call the front office.  - If your results need further discussion, our office will attempt to contact you via phone, and if unable to reach you after 2 attempts, we will release your abnormal result to your mychart with instructions.  - All results will be automatically released in mychart after 1 week.  - Your provider will provide you with explanation and instruction on all relevant material in your results. Please keep in mind, results and labs may appear confusing or abnormal to the untrained eye, but it does not mean they are actually abnormal for you personally. If you have any questions about your results that are not covered, or you desire more detailed explanation than what was provided, you should make an appointment with your provider to do so.   Our office handles many outgoing and incoming calls daily. If we have not contacted you within 1 week about your results, please check your mychart to see if there is a message first and if not, then contact our office.  In helping with this matter, you help decrease call volume, and therefore allow [place] to be able to respond to patients needs more efficiently.   Acute office visits (sick visit):  An acute visit is intended for a new problem and are scheduled in shorter time slots  to allow schedule openings for patients with new problems. This is the appropriate visit to discuss a new problem. In order to provide you with excellent quality medical care with proper time for you to explain your problem, have an exam and receive treatment with instructions, these appointments should be limited to one new problem per visit. If you experience a new problem, in which you desire to be addressed, please make an acute office visit, we save openings on the schedule to accommodate you. Please do not save  your new problem for any other type of visit, let us take care of it properly and quickly for you.   Follow up visits:  Depending on your condition(s) your provider will need to see you routinely in order to provide you with quality care and prescribe medication(s). Most chronic conditions (Example: hypertension, Diabetes, depression/anxiety... etc), require visits a couple times a year. Your provider will instruct you on proper follow up for your personal medical conditions and history. Please make certain to make follow up appointments for your condition as instructed. Failing to do so could result in lapse in your medication treatment/refills. If you request a refill, and are overdue to be seen on a condition, we will always provide you with a 30 day script (once) to allow you time to schedule.    Medicare wellness (well visit): - we have a wonderful Nurse Maudie Mercury), that will meet with you and provide you will yearly medicare wellness visits. These visits should occur yearly (can not be scheduled less than 1 calendar year apart) and cover preventive health, immunizations, advance directives and screenings you are entitled to yearly through your medicare benefits. Do not miss out on your entitled benefits, this is when medicare will pay for these benefits to be ordered for you.  These are strongly encouraged by your provider and is the appropriate type of visit to make certain you are up to date with all preventive health benefits. If you have not had your medicare wellness exam in the last 12 months, please make certain to schedule one by calling the office and schedule your medicare wellness with Maudie Mercury as soon as possible.   Yearly physical (well visit):  - Adults are recommended to be seen yearly for physicals. Check with your insurance and date of your last physical, most insurances require one calendar year between physicals. Physicals include all preventive health topics, screenings, medical exam and  labs that are appropriate for gender/age and history. You may have fasting labs needed at this visit. This is a well visit (not a sick visit), acute topics should not be covered during this visit.  - Pediatric patients are seen more frequently when they are younger. Your provider will advise you on well child visit timing that is appropriate for your their age. - This is not a medicare wellness visit. Medicare wellness exams do not have an exam portion to the visit. Some medicare companies allow for a physical, some do not allow a yearly physical. If your medicare allows a yearly physical you can schedule the medicare wellness with our nurse Maudie Mercury and have your physical with your provider after, on the same day. Please check with insurance for your full benefits.   Late Policy/No Shows:  - all new patients should arrive 15-30 minutes earlier than appointment to allow [place] time  to  obtain all personal demographics,  insurance information and for you to complete office paperwork. - All established patients should arrive 10-15 minutes earlier than  appointment time to update all information and be checked in .  - In our best efforts to run on time, if you are late for your appointment you will be asked to either reschedule or if able, we will work you back into the schedule. There will be a wait time to work you back in the schedule,  depending on availability.  - If you are unable to make it to your appointment as scheduled, please call 24 hours ahead of time to allow [place] to fill the time slot with someone else who needs to be seen. If you do not cancel your appointment ahead of time, you may be charged a no show fee.     Health Maintenance, Female Introduction Adopting a healthy lifestyle and getting preventive care can go a long way to promote health and wellness. Talk with your health care provider about what schedule of regular examinations is right for you. This is a good chance for you to check in with  your provider about disease prevention and staying healthy. In between checkups, there are plenty of things you can do on your own. Experts have done a lot of research about which lifestyle changes and preventive measures are most likely to keep you healthy. Ask your health care provider for more information. Weight and diet Eat a healthy diet  Be sure to include plenty of vegetables, fruits, low-fat dairy products, and lean protein.  Do not eat a lot of foods high in solid fats, added sugars, or salt.  Get regular exercise. This is one of the most important things you can do for your health.  Most adults should exercise for at least 150 minutes each week. The exercise should increase your heart rate and make you sweat (moderate-intensity exercise).  Most adults should also do strengthening exercises at least twice a week. This is in addition to the moderate-intensity exercise. Maintain a healthy weight  Body mass index (BMI) is a measurement that can be used to identify possible weight problems. It estimates body fat based on height and weight. Your health care provider can help determine your BMI and help you achieve or maintain a healthy weight.  For females 10 years of age and older:  A BMI below 18.5 is considered underweight.  A BMI of 18.5 to 24.9 is normal.  A BMI of 25 to 29.9 is considered overweight.  A BMI of 30 and above is considered obese. Watch levels of cholesterol and blood lipids  You should start having your blood tested for lipids and cholesterol at 42 years of age, then have this test every 5 years.  You may need to have your cholesterol levels checked more often if:  Your lipid or cholesterol levels are high.  You are older than 42 years of age.  You are at high risk for heart disease. Cancer screening Lung Cancer  Lung cancer screening is recommended for adults 49-22 years old who are at high risk for lung cancer because of a history of smoking.  A  yearly low-dose CT scan of the lungs is recommended for people who:  Currently smoke.  Have quit within the past 15 years.  Have at least a 30-pack-year history of smoking. A pack year is smoking an average of one pack of cigarettes a day for 1 year.  Yearly screening should continue until it has been 15 years since you quit.  Yearly screening should stop if you develop a health problem that would prevent you from  having lung cancer treatment. Breast Cancer  Practice breast self-awareness. This means understanding how your breasts normally appear and feel.  It also means doing regular breast self-exams. Let your health care provider know about any changes, no matter how small.  If you are in your 20s or 30s, you should have a clinical breast exam (CBE) by a health care provider every 1-3 years as part of a regular health exam.  If you are 41 or older, have a CBE every year. Also consider having a breast X-ray (mammogram) every year.  If you have a family history of breast cancer, talk to your health care provider about genetic screening.  If you are at high risk for breast cancer, talk to your health care provider about having an MRI and a mammogram every year.  Breast cancer gene (BRCA) assessment is recommended for women who have family members with BRCA-related cancers. BRCA-related cancers include:  Breast.  Ovarian.  Tubal.  Peritoneal cancers.  Results of the assessment will determine the need for genetic counseling and BRCA1 and BRCA2 testing. Cervical Cancer  Your health care provider may recommend that you be screened regularly for cancer of the pelvic organs (ovaries, uterus, and vagina). This screening involves a pelvic examination, including checking for microscopic changes to the surface of your cervix (Pap test). You may be encouraged to have this screening done every 3 years, beginning at age 10.  For women ages 32-65, health care providers may recommend pelvic  exams and Pap testing every 3 years, or they may recommend the Pap and pelvic exam, combined with testing for human papilloma virus (HPV), every 5 years. Some types of HPV increase your risk of cervical cancer. Testing for HPV may also be done on women of any age with unclear Pap test results.  Other health care providers may not recommend any screening for nonpregnant women who are considered low risk for pelvic cancer and who do not have symptoms. Ask your health care provider if a screening pelvic exam is right for you.  If you have had past treatment for cervical cancer or a condition that could lead to cancer, you need Pap tests and screening for cancer for at least 20 years after your treatment. If Pap tests have been discontinued, your risk factors (such as having a new sexual partner) need to be reassessed to determine if screening should resume. Some women have medical problems that increase the chance of getting cervical cancer. In these cases, your health care provider may recommend more frequent screening and Pap tests. Colorectal Cancer  This type of cancer can be detected and often prevented.  Routine colorectal cancer screening usually begins at 42 years of age and continues through 42 years of age.  Your health care provider may recommend screening at an earlier age if you have risk factors for colon cancer.  Your health care provider may also recommend using home test kits to check for hidden blood in the stool.  A small camera at the end of a tube can be used to examine your colon directly (sigmoidoscopy or colonoscopy). This is done to check for the earliest forms of colorectal cancer.  Routine screening usually begins at age 61.  Direct examination of the colon should be repeated every 5-10 years through 42 years of age. However, you may need to be screened more often if early forms of precancerous polyps or small growths are found. Skin Cancer  Check your skin from head to  toe regularly.  Tell your health care provider about any new moles or changes in moles, especially if there is a change in a mole's shape or color.  Also tell your health care provider if you have a mole that is larger than the size of a pencil eraser.  Always use sunscreen. Apply sunscreen liberally and repeatedly throughout the day.  Protect yourself by wearing long sleeves, pants, a wide-brimmed hat, and sunglasses whenever you are outside. Heart disease, diabetes, and high blood pressure  High blood pressure causes heart disease and increases the risk of stroke. High blood pressure is more likely to develop in:  People who have blood pressure in the high end of the normal range (130-139/85-89 mm Hg).  People who are overweight or obese.  People who are African American.  If you are 51-65 years of age, have your blood pressure checked every 3-5 years. If you are 74 years of age or older, have your blood pressure checked every year. You should have your blood pressure measured twice-once when you are at a hospital or clinic, and once when you are not at a hospital or clinic. Record the average of the two measurements. To check your blood pressure when you are not at a hospital or clinic, you can use:  An automated blood pressure machine at a pharmacy.  A home blood pressure monitor.  If you are between 38 years and 36 years old, ask your health care provider if you should take aspirin to prevent strokes.  Have regular diabetes screenings. This involves taking a blood sample to check your fasting blood sugar level.  If you are at a normal weight and have a low risk for diabetes, have this test once every three years after 42 years of age.  If you are overweight and have a high risk for diabetes, consider being tested at a younger age or more often. Preventing infection Hepatitis B  If you have a higher risk for hepatitis B, you should be screened for this virus. You are considered  at high risk for hepatitis B if:  You were born in a country where hepatitis B is common. Ask your health care provider which countries are considered high risk.  Your parents were born in a high-risk country, and you have not been immunized against hepatitis B (hepatitis B vaccine).  You have HIV or AIDS.  You use needles to inject street drugs.  You live with someone who has hepatitis B.  You have had sex with someone who has hepatitis B.  You get hemodialysis treatment.  You take certain medicines for conditions, including cancer, organ transplantation, and autoimmune conditions. Hepatitis C  Blood testing is recommended for:  Everyone born from [birth year] through 1967.  Anyone with known risk factors for hepatitis C. Sexually transmitted infections (STIs)  You should be screened for sexually transmitted infections (STIs) including gonorrhea and chlamydia if:  You are sexually active and are younger than 42 years of age.  You are older than 42 years of age and your health care provider tells you that you are at risk for this type of infection.  Your sexual activity has changed since you were last screened and you are at an increased risk for chlamydia or gonorrhea. Ask your health care provider if you are at risk.  If you do not have HIV, but are at risk, it may be recommended that you take a prescription medicine daily to prevent HIV infection. This is called pre-exposure prophylaxis (PrEP). You  are considered at risk if:  You are sexually active and do not regularly use condoms or know the HIV status of your partner(s).  You take drugs by injection.  You are sexually active with a partner who has HIV. Talk with your health care provider about whether you are at high risk of being infected with HIV. If you choose to begin PrEP, you should first be tested for HIV. You should then be tested every 3 months for as long as you are taking PrEP. Pregnancy  If you are premenopausal  and you may become pregnant, ask your health care provider about preconception counseling.  If you may become pregnant, take 400 to 800 micrograms (mcg) of folic acid every day.  If you want to prevent pregnancy, talk to your health care provider about birth control (contraception). Osteoporosis and menopause  Osteoporosis is a disease in which the bones lose minerals and strength with aging. This can result in serious bone fractures. Your risk for osteoporosis can be identified using a bone density scan.  If you are 58 years of age or older, or if you are at risk for osteoporosis and fractures, ask your health care provider if you should be screened.  Ask your health care provider whether you should take a calcium or vitamin D supplement to lower your risk for osteoporosis.  Menopause may have certain physical symptoms and risks.  Hormone replacement therapy may reduce some of these symptoms and risks. Talk to your health care provider about whether hormone replacement therapy is right for you. Follow these instructions at home:  Schedule regular health, dental, and eye exams.  Stay current with your immunizations.  Do not use any tobacco products including cigarettes, chewing tobacco, or electronic cigarettes.  If you are pregnant, do not drink alcohol.  If you are breastfeeding, limit how much and how often you drink alcohol.  Limit alcohol intake to no more than 1 drink per day for nonpregnant women. One drink equals 12 ounces of beer, 5 ounces of wine, or 1 ounces of hard liquor.  Do not use street drugs.  Do not share needles.  Ask your health care provider for help if you need support or information about quitting drugs.  Tell your health care provider if you often feel depressed.  Tell your health care provider if you have ever been abused or do not feel safe at home. This information is not intended to replace advice given to you by your health care provider. Make  sure you discuss any questions you have with your health care provider. Document Released: 05/06/2011 Document Revised: 03/28/2016 Document Reviewed: 07/25/2015  2017 [employer]

## 2016-11-01 NOTE — Progress Notes (Signed)
   Patient ID: Kayla Reeves, female  DOB: 03/14/1976, 40 y.o.   MRN: [id number] Patient Care Team    Relationship Specialty Notifications Start End  Renee A Kuneff, DO PCP - General Family Medicine  10/10/16   Renee A Kuneff, DO PCP - Family Medicine Family Medicine  07/05/15     Subjective:  Kayla Reeves is a 40 y.o.  Female  present for CPE. All past medical history, surgical history, allergies, family history, immunizations, medications and social history were updated in the electronic medical record today. All recent labs, ED visits and hospitalizations within the last year were reviewed.  Well women: Pt reports unchanged cycles, occurring every ~30 days, last ~ 5 days. She denies dyspareunia, vaginal discharge/irritation or urinary complaints. She performs SBE. She has never had a mammogram, her half sister has had breast cancer.  She performs SBE. PAP UTD, Due 2019.   Skin lesion: small lesion left forearm, present for about 2-3 months. No prior h/o skin cancer.   Health maintenance:  Colonoscopy: No Fhx, screen at 48 Mammogram: completed:Never. FHX breast cancer in half sister.  Cervical cancer screening: last pap 02/2015:, results: normal, neg HPV, completed by: PCP Immunizations: tdap 02/2007, Influenza declined (encouraged yearly) Infectious disease screening: HIV completed DEXA: N/A Assistive device: none Oxygen use:none Patient has a Dental home. Hospitalizations/ED visits: None  Depression screen PHQ 2/9 11/01/2016  Decreased Interest 0  Down, Depressed, Hopeless 0  PHQ - 2 Score 0   Current Exercise Habits: Structured exercise class, Type of exercise: yoga;walking, Time (Minutes): 60, Frequency (Times/Week): 4, Weekly Exercise (Minutes/Week): 240, Intensity: Moderate    Immunization History  Administered Date(s) Administered  . Tdap 11/04/2006     Past Medical History:  Diagnosis Date  . Allergy   . Seasonal allergies    No Known Allergies Past Surgical  History:  Procedure Laterality Date  . NO PAST SURGERIES     Family History  Problem Relation Age of Onset  . Diabetes Father   . Aneurysm Father     brain  . Arthritis Mother     rheumatoid  . Stroke Mother   . High blood pressure Mother   . Breast cancer Sister     breast  . COPD Maternal Grandmother   . Stroke Paternal Grandmother   . Heart disease Paternal Grandfather    Social History   Social History  . Marital status: Married    Spouse name: N/A  . Number of children: 3  . Years of education: [employer]   Occupational History  . SAHM    Social History Main Topics  . Smoking status: Never Smoker  . Smokeless tobacco: Never Used  . Alcohol use 0.6 oz/week    1 Glasses of wine per week  . Drug use: No  . Sexual activity: Yes    Partners: Male    Birth control/ protection: Surgical   Other Topics Concern  . Not on file   Social History Narrative   Kayla Reeves lives with husband & 3 kids. 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   Allergies as of 11/01/2016   No Known Allergies     Medication List    as of 11/01/2016 12:16 PM   You have not been prescribed any medications.      No results found for this or any previous visit (from the past 2160 hour(s)).  No results found.   ROS: 14 pt review of systems performed and negative (unless mentioned in an HPI)    Objective: BP 123/86 (BP Location: Right Arm, Patient Position: Sitting, Cuff Size: Normal)   Pulse 94   Temp 98.4 F (36.9 C)   Resp 20   Ht 5' 2" (1.575 m)   Wt 167 lb 12 oz (76.1 kg)   LMP 10/03/2016   SpO2 99%   BMI 30.68 kg/m  Gen: Afebrile. No acute distress. Nontoxic in appearance, well-developed, well-nourished,  Overweight, pleasant female.  HENT: AT. [address]. Bilateral TM visualized and normal in appearance, normal external auditory canal. MMM, no oral lesions, adequate dentition. Bilateral nares within normal limits. Throat without erythema, ulcerations or exudates. no Cough on exam, no  hoarseness on exam. Eyes:Pupils Equal Round Reactive to light, Extraocular movements intact,  Conjunctiva without redness, discharge or icterus. Neck/lymp/endocrine: Supple,no lymphadenopathy, no thyromegaly CV: RRR no murmur, no edema, +2/4 P posterior tibialis pulses. no carotid bruits. No JVD. Chest: CTAB, no wheeze, rhonchi or crackles. Normal  Respiratory effort. good Air movement. Abd: Soft. flat. NTND. BS present. no Masses palpated. No hepatosplenomegaly. No rebound tenderness or guarding. Skin: no rashes, purpura or petechiae. Warm and well-perfused. Skin intact. Neuro/Msk:  Normal gait. PERLA. EOMi. Alert. Oriented x3.  Cranial nerves II through XII intact. Muscle strength 5/5 upper/lower extremity. DTRs equal bilaterally. Psych: Normal affect, dress and demeanor. Normal speech. Normal thought content and judgment. Skin lesion   Assessment/plan: [name] is a 40 y.o. female present for CPE. Preventative health care Patient was encouraged to exercise greater than 150 minutes a week. Patient was encouraged to choose a diet filled with fresh fruits and vegetables, and lean meats. AVS provided to patient today for education/recommendation on gender specific health and safety maintenance Colonoscopy: No Fhx, screen at 50 Mammogram: completed:Never. FHX breast cancer in half sister. Ordered today at Breast center.   Cervical cancer screening: last pap 02/2015:, results: normal, neg HPV, completed by: PCP Immunizations: tdap 02/2007, Influenza declined (encouraged yearly) Infectious disease screening: HIV completed DEXA: N/A BMI 30.0-30.9,adult - dicussed diet and exercise modifications.  - > 150 m exercise a week recommended.  - TSH - Lipid panel - HgB A1c Breast cancer screening - MM DIGITAL SCREENING BILATERAL; Future Screening for deficiency anemia - CBC w/Diff - Comp Met (CMET) Vitamin D deficiency - Vitamin D (25 hydroxy) Keratosis, seborrheic - discussed this appears  to be a very small seborrheic keratosis on exam. Discussed using loofa/lava rock over area to exfoliate. If does not resolve or returns/changes color etc would want to see back.  . Return in about 1 year (around 11/01/2017) for CPE.  Electronically signed by: Renee Kuneff, DO [facility]  

## 2016-11-05 ENCOUNTER — Telehealth: Payer: Self-pay | Admitting: Family Medicine

## 2016-11-05 DIAGNOSIS — E559 Vitamin D deficiency, unspecified: Secondary | ICD-10-CM | POA: Insufficient documentation

## 2016-11-05 MED ORDER — VITAMIN D (ERGOCALCIFEROL) 1.25 MG (50000 UNIT) PO CAPS: 50000.0000 [IU] | ORAL_CAPSULE | ORAL | 0 refills | Status: DC

## 2016-11-05 NOTE — Telephone Encounter (Signed)
Patient notified, given instructions and patient verbalized understanding.

## 2016-11-05 NOTE — Telephone Encounter (Signed)
Please call pt: - her labs are all normal, with the exception of very low Vit D (18). She has a h/o of deficiency, but did not state she was on supplement.  I have called in weekly script for 12 weeks. She then will need retesting (lab appt only), and if retest is normal she should continue and OTC supplement of 1000 u daily.

## 2016-12-10 ENCOUNTER — Ambulatory Visit
Admission: RE | Admit: 2016-12-10 | Discharge: 2016-12-10 | Disposition: A | Discharge status: Home / Self Care | Payer: 59 | Source: Ambulatory Visit | Attending: Family Medicine | Admitting: Family Medicine

## 2016-12-10 DIAGNOSIS — Z1239 Encounter for other screening for malignant neoplasm of breast: Secondary | ICD-10-CM

## 2016-12-11 ENCOUNTER — Other Ambulatory Visit: Payer: Self-pay | Admitting: Family Medicine

## 2016-12-11 DIAGNOSIS — R928 Other abnormal and inconclusive findings on diagnostic imaging of breast: Secondary | ICD-10-CM

## 2016-12-17 ENCOUNTER — Ambulatory Visit
Admission: RE | Admit: 2016-12-17 | Discharge: 2016-12-17 | Disposition: A | Discharge status: Home / Self Care | Payer: 59 | Source: Ambulatory Visit | Attending: Family Medicine | Admitting: Family Medicine

## 2016-12-17 DIAGNOSIS — R928 Other abnormal and inconclusive findings on diagnostic imaging of breast: Secondary | ICD-10-CM

## 2017-11-03 ENCOUNTER — Ambulatory Visit (INDEPENDENT_AMBULATORY_CARE_PROVIDER_SITE_OTHER): Payer: 59 | Admitting: Family Medicine

## 2017-11-03 ENCOUNTER — Encounter: Payer: Self-pay | Admitting: Family Medicine

## 2017-11-03 VITALS — BP 144/90 | HR 91 | Temp 98.0°F | Ht 62.0 in | Wt 170.4 lb

## 2017-11-03 DIAGNOSIS — Z130 Encounter for screening for diseases of the blood and blood-forming organs and certain disorders involving the immune mechanism: Secondary | ICD-10-CM

## 2017-11-03 DIAGNOSIS — E669 Obesity, unspecified: Secondary | ICD-10-CM

## 2017-11-03 DIAGNOSIS — Z0001 Encounter for general adult medical examination with abnormal findings: Secondary | ICD-10-CM | POA: Diagnosis not present

## 2017-11-03 DIAGNOSIS — Z0000 Encounter for general adult medical examination without abnormal findings: Secondary | ICD-10-CM

## 2017-11-03 DIAGNOSIS — Z131 Encounter for screening for diabetes mellitus: Secondary | ICD-10-CM | POA: Diagnosis not present

## 2017-11-03 DIAGNOSIS — Z1239 Encounter for other screening for malignant neoplasm of breast: Secondary | ICD-10-CM

## 2017-11-03 DIAGNOSIS — Z23 Encounter for immunization: Secondary | ICD-10-CM | POA: Diagnosis not present

## 2017-11-03 DIAGNOSIS — E559 Vitamin D deficiency, unspecified: Secondary | ICD-10-CM

## 2017-11-03 DIAGNOSIS — R030 Elevated blood-pressure reading, without diagnosis of hypertension: Secondary | ICD-10-CM | POA: Diagnosis not present

## 2017-11-03 DIAGNOSIS — Z1231 Encounter for screening mammogram for malignant neoplasm of breast: Secondary | ICD-10-CM | POA: Diagnosis not present

## 2017-11-03 LAB — CBC WITH DIFFERENTIAL/PLATELET
BASOS PCT: 0.7 % (ref 0.0–3.0)
Basophils Absolute: 0.1 10*3/uL (ref 0.0–0.1)
EOS PCT: 3.6 % (ref 0.0–5.0)
Eosinophils Absolute: 0.3 10*3/uL (ref 0.0–0.7)
HCT: 38.3 % (ref 36.0–46.0)
Hemoglobin: 12.6 g/dL (ref 12.0–15.0)
LYMPHS ABS: 2.3 10*3/uL (ref 0.7–4.0)
Lymphocytes Relative: 25 % (ref 12.0–46.0)
MCHC: 33 g/dL (ref 30.0–36.0)
MCV: 91.8 fl (ref 78.0–100.0)
MONOS PCT: 7.8 % (ref 3.0–12.0)
Monocytes Absolute: 0.7 10*3/uL (ref 0.1–1.0)
NEUTROS ABS: 5.8 10*3/uL (ref 1.4–7.7)
NEUTROS PCT: 62.9 % (ref 43.0–77.0)
PLATELETS: 388 10*3/uL (ref 150.0–400.0)
RBC: 4.17 Mil/uL (ref 3.87–5.11)
RDW: 13.8 % (ref 11.5–15.5)
WBC: 9.2 10*3/uL (ref 4.0–10.5)

## 2017-11-03 LAB — COMPREHENSIVE METABOLIC PANEL
ALK PHOS: 52 U/L (ref 39–117)
ALT: 23 U/L (ref 0–35)
AST: 19 U/L (ref 0–37)
Albumin: 4.3 g/dL (ref 3.5–5.2)
BUN: 11 mg/dL (ref 6–23)
CHLORIDE: 103 meq/L (ref 96–112)
CO2: 26 mEq/L (ref 19–32)
Calcium: 9.2 mg/dL (ref 8.4–10.5)
Creatinine, Ser: 0.72 mg/dL (ref 0.40–1.20)
GFR: 93.6 mL/min (ref >60.00)
GLUCOSE: 89 mg/dL (ref 70–99)
POTASSIUM: 4.3 meq/L (ref 3.5–5.1)
Sodium: 138 mEq/L (ref 135–145)
TOTAL PROTEIN: 7.6 g/dL (ref 6.0–8.3)
Total Bilirubin: 0.4 mg/dL (ref 0.2–1.2)

## 2017-11-03 LAB — LIPID PANEL
CHOLESTEROL: 166 mg/dL (ref 0–200)
HDL: 62.2 mg/dL (ref >39.00)
LDL Cholesterol: 88 mg/dL (ref 0–99)
NONHDL: 104.17
Total CHOL/HDL Ratio: 3
Triglycerides: 83 mg/dL (ref 0.0–149.0)
VLDL: 16.6 mg/dL (ref 0.0–40.0)

## 2017-11-03 LAB — HEMOGLOBIN A1C: Hgb A1c MFr Bld: 5.2 % (ref 4.6–6.5)

## 2017-11-03 LAB — TSH: TSH: 4.07 u[IU]/mL (ref 0.35–4.50)

## 2017-11-03 LAB — VITAMIN D 25 HYDROXY (VIT D DEFICIENCY, FRACTURES): VITD: 21.82 ng/mL — ABNORMAL LOW (ref 30.00–100.00)

## 2017-11-03 NOTE — Patient Instructions (Signed)
We will call you with the results of your labs once available.   Health Maintenance, Female Adopting a healthy lifestyle and getting preventive care can go a long way to promote health and wellness. Talk with your health care provider about what schedule of regular examinations is right for you. This is a good chance for you to check in with your provider about disease prevention and staying healthy. In between checkups, there are plenty of things you can do on your own. Experts have done a lot of research about which lifestyle changes and preventive measures are most likely to keep you healthy. Ask your health care provider for more information. Weight and diet Eat a healthy diet  Be sure to include plenty of vegetables, fruits, low-fat dairy products, and lean protein.  Do not eat a lot of foods high in solid fats, added sugars, or salt.  Get regular exercise. This is one of the most important things you can do for your health. ? Most adults should exercise for at least 150 minutes each week. The exercise should increase your heart rate and make you sweat (moderate-intensity exercise). ? Most adults should also do strengthening exercises at least twice a week. This is in addition to the moderate-intensity exercise.  Maintain a healthy weight  Body mass index (BMI) is a measurement that can be used to identify possible weight problems. It estimates body fat based on height and weight. Your health care provider can help determine your BMI and help you achieve or maintain a healthy weight.  For females 87 years of age and older: ? A BMI below 18.5 is considered underweight. ? A BMI of 18.5 to 24.9 is normal. ? A BMI of 25 to 29.9 is considered overweight. ? A BMI of 30 and above is considered obese.  Watch levels of cholesterol and blood lipids  You should start having your blood tested for lipids and cholesterol at 43 years of age, then have this test every 5 years.  You may need to  have your cholesterol levels checked more often if: ? Your lipid or cholesterol levels are high. ? You are older than 43 years of age. ? You are at high risk for heart disease.  Cancer screening Lung Cancer  Lung cancer screening is recommended for adults 54-43 years old who are at high risk for lung cancer because of a history of smoking.  A yearly low-dose CT scan of the lungs is recommended for people who: ? Currently smoke. ? Have quit within the past 15 years. ? Have at least a 30-pack-year history of smoking. A pack year is smoking an average of one pack of cigarettes a day for 1 year.  Yearly screening should continue until it has been 15 years since you quit.  Yearly screening should stop if you develop a health problem that would prevent you from having lung cancer treatment.  Breast Cancer  Practice breast self-awareness. This means understanding how your breasts normally appear and feel.  It also means doing regular breast self-exams. Let your health care provider know about any changes, no matter how small.  If you are in your 43s or 30s, you should have a clinical breast exam (CBE) by a health care provider every 1-3 years as part of a regular health exam.  If you are 75 or older, have a CBE every year. Also consider having a breast X-ray (mammogram) every year.  If you have a family history of breast cancer, talk to  your health care provider about genetic screening.  If you are at high risk for breast cancer, talk to your health care provider about having an MRI and a mammogram every year.  Breast cancer gene (BRCA) assessment is recommended for women who have family members with BRCA-related cancers. BRCA-related cancers include: ? Breast. ? Ovarian. ? Tubal. ? Peritoneal cancers.  Results of the assessment will determine the need for genetic counseling and BRCA1 and BRCA2 testing.  Cervical Cancer Your health care provider may recommend that you be screened  regularly for cancer of the pelvic organs (ovaries, uterus, and vagina). This screening involves a pelvic examination, including checking for microscopic changes to the surface of your cervix (Pap test). You may be encouraged to have this screening done every 3 years, beginning at age 43.  For women ages 9-65, health care providers may recommend pelvic exams and Pap testing every 3 years, or they may recommend the Pap and pelvic exam, combined with testing for human papilloma virus (HPV), every 5 years. Some types of HPV increase your risk of cervical cancer. Testing for HPV may also be done on women of any age with unclear Pap test results.  Other health care providers may not recommend any screening for nonpregnant women who are considered low risk for pelvic cancer and who do not have symptoms. Ask your health care provider if a screening pelvic exam is right for you.  If you have had past treatment for cervical cancer or a condition that could lead to cancer, you need Pap tests and screening for cancer for at least 20 years after your treatment. If Pap tests have been discontinued, your risk factors (such as having a new sexual partner) need to be reassessed to determine if screening should resume. Some women have medical problems that increase the chance of getting cervical cancer. In these cases, your health care provider may recommend more frequent screening and Pap tests.  Colorectal Cancer  This type of cancer can be detected and often prevented.  Routine colorectal cancer screening usually begins at 43 years of age and continues through 43 years of age.  Your health care provider may recommend screening at an earlier age if you have risk factors for colon cancer.  Your health care provider may also recommend using home test kits to check for hidden blood in the stool.  A small camera at the end of a tube can be used to examine your colon directly (sigmoidoscopy or colonoscopy). This is  done to check for the earliest forms of colorectal cancer.  Routine screening usually begins at age 45.  Direct examination of the colon should be repeated every 5-10 years through 43 years of age. However, you may need to be screened more often if early forms of precancerous polyps or small growths are found.  Skin Cancer  Check your skin from head to toe regularly.  Tell your health care provider about any new moles or changes in moles, especially if there is a change in a mole's shape or color.  Also tell your health care provider if you have a mole that is larger than the size of a pencil eraser.  Always use sunscreen. Apply sunscreen liberally and repeatedly throughout the day.  Protect yourself by wearing long sleeves, pants, a wide-brimmed hat, and sunglasses whenever you are outside.  Heart disease, diabetes, and high blood pressure  High blood pressure causes heart disease and increases the risk of stroke. High blood pressure is more likely  to develop in: ? People who have blood pressure in the high end of the normal range (130-139/85-89 mm Hg). ? People who are overweight or obese. ? People who are African American.  If you are 41-24 years of age, have your blood pressure checked every 3-5 years. If you are 31 years of age or older, have your blood pressure checked every year. You should have your blood pressure measured twice-once when you are at a hospital or clinic, and once when you are not at a hospital or clinic. Record the average of the two measurements. To check your blood pressure when you are not at a hospital or clinic, you can use: ? An automated blood pressure machine at a pharmacy. ? A home blood pressure monitor.  If you are between 45 years and 72 years old, ask your health care provider if you should take aspirin to prevent strokes.  Have regular diabetes screenings. This involves taking a blood sample to check your fasting blood sugar level. ? If you are  at a normal weight and have a low risk for diabetes, have this test once every three years after 43 years of age. ? If you are overweight and have a high risk for diabetes, consider being tested at a younger age or more often. Preventing infection Hepatitis B  If you have a higher risk for hepatitis B, you should be screened for this virus. You are considered at high risk for hepatitis B if: ? You were born in a country where hepatitis B is common. Ask your health care provider which countries are considered high risk. ? Your parents were born in a high-risk country, and you have not been immunized against hepatitis B (hepatitis B vaccine). ? You have HIV or AIDS. ? You use needles to inject street drugs. ? You live with someone who has hepatitis B. ? You have had sex with someone who has hepatitis B. ? You get hemodialysis treatment. ? You take certain medicines for conditions, including cancer, organ transplantation, and autoimmune conditions.  Hepatitis C  Blood testing is recommended for: ? Everyone born from [birth year] through 1967. ? Anyone with known risk factors for hepatitis C.  Sexually transmitted infections (STIs)  You should be screened for sexually transmitted infections (STIs) including gonorrhea and chlamydia if: ? You are sexually active and are younger than 43 years of age. ? You are older than 43 years of age and your health care provider tells you that you are at risk for this type of infection. ? Your sexual activity has changed since you were last screened and you are at an increased risk for chlamydia or gonorrhea. Ask your health care provider if you are at risk.  If you do not have HIV, but are at risk, it may be recommended that you take a prescription medicine daily to prevent HIV infection. This is called pre-exposure prophylaxis (PrEP). You are considered at risk if: ? You are sexually active and do not regularly use condoms or know the HIV status of your  partner(s). ? You take drugs by injection. ? You are sexually active with a partner who has HIV.  Talk with your health care provider about whether you are at high risk of being infected with HIV. If you choose to begin PrEP, you should first be tested for HIV. You should then be tested every 3 months for as long as you are taking PrEP. Pregnancy  If you are premenopausal and you may become  pregnant, ask your health care provider about preconception counseling.  If you may become pregnant, take 400 to 800 micrograms (mcg) of folic acid every day.  If you want to prevent pregnancy, talk to your health care provider about birth control (contraception). Osteoporosis and menopause  Osteoporosis is a disease in which the bones lose minerals and strength with aging. This can result in serious bone fractures. Your risk for osteoporosis can be identified using a bone density scan.  If you are 45 years of age or older, or if you are at risk for osteoporosis and fractures, ask your health care provider if you should be screened.  Ask your health care provider whether you should take a calcium or vitamin D supplement to lower your risk for osteoporosis.  Menopause may have certain physical symptoms and risks.  Hormone replacement therapy may reduce some of these symptoms and risks. Talk to your health care provider about whether hormone replacement therapy is right for you. Follow these instructions at home:  Schedule regular health, dental, and eye exams.  Stay current with your immunizations.  Do not use any tobacco products including cigarettes, chewing tobacco, or electronic cigarettes.  If you are pregnant, do not drink alcohol.  If you are breastfeeding, limit how much and how often you drink alcohol.  Limit alcohol intake to no more than 1 drink per day for nonpregnant women. One drink equals 12 ounces of beer, 5 ounces of wine, or 1 ounces of hard liquor.  Do not use street  drugs.  Do not share needles.  Ask your health care provider for help if you need support or information about quitting drugs.  Tell your health care provider if you often feel depressed.  Tell your health care provider if you have ever been abused or do not feel safe at home. This information is not intended to replace advice given to you by your health care provider. Make sure you discuss any questions you have with your health care provider. Document Released: 05/06/2011 Document Revised: 03/28/2016 Document Reviewed: 07/25/2015 Elsevier Interactive Patient Education  [employer].

## 2017-11-03 NOTE — Progress Notes (Signed)
Patient ID: Kayla Reeves, female  DOB: 1976-01-04, 41 y.o.   MRN: [id number] Patient Care Team    Relationship Specialty Notifications Start End  Ma Hillock, DO PCP - General Family Medicine  10/10/16   Ma Hillock, DO PCP - Family Medicine Family Medicine  07/05/15     Subjective:  Kayla Reeves is a 41 y.o.  Female  present for CPE. All past medical history, surgical history, allergies, family history, immunizations, medications and social history were updated in the electronic medical record today. All recent labs, ED visits and hospitalizations within the last year were reviewed.  Well woman: Patient reports rather unchanged cycles occurring every 30 days. With the exception of one menstrual cycle earlier in the year came later, then her cycle regulated back to her normal. Her cycles last approximately 5 days. Her last Pap was completed 2016 with negative HPV, normal Pap. Her mammogram is up-to-date and he February 2019. She has a family history of breast cancer in her half sister. She performs self breast exams. She does not have any complaints.  Health maintenance: Updated 09/03/2017. Colonoscopy: No Fhx, screen at 48 Mammogram: Completed  12/10/2016 BI-RADS 0 (left breast), ultrasound completed 12/17/2016 and no abnormalities. Return to normal scheduled yearly mammograms. Family history in half sister. Yearly Mammogram ordered today. Cervical cancer screening: last pap 02/2015:, results: normal, neg HPV, completed by: PCP Immunizations: tdap completed today, Influenza declined (encouraged yearly) Infectious disease screening: HIV completed DEXA: N/A Assistive device: none Oxygen AOZ:HYQM Patient has a Dental home. Hospitalizations/ED visits: Reviewed  Depression screen [facility] 2/9 11/03/2017 11/01/2016  Decreased Interest 0 0  Down, Depressed, Hopeless 0 0  PHQ - 2 Score 0 0   Current Exercise Habits: Home exercise routine, Type of exercise: yoga;walking, Time  (Minutes): 60, Frequency (Times/Week): 3, Weekly Exercise (Minutes/Week): 180, Intensity: Mild    Immunization History  Administered Date(s) Administered  . Tdap 11/04/2006, 11/03/2017     Past Medical History:  Diagnosis Date  . Allergy   . Seasonal allergies    No Known Allergies Past Surgical History:  Procedure Laterality Date  . NO PAST SURGERIES     Family History  Problem Relation Age of Onset  . Diabetes Father   . Aneurysm Father        brain  . Arthritis Mother        rheumatoid  . Stroke Mother   . High blood pressure Mother   . Breast cancer Sister        breast  . COPD Maternal Grandmother   . Stroke Paternal Grandmother   . Heart disease Paternal Grandfather   . Breast cancer Maternal Aunt    Social History   Socioeconomic History  . Marital status: Married    Spouse name: Not on file  . Number of children: 3  . Years of education: [employer]  . Highest education level: Not on file  Social Needs  . Financial resource strain: Not on file  . Food insecurity - worry: Not on file  . Food insecurity - inability: Not on file  . Transportation needs - medical: Not on file  . Transportation needs - non-medical: Not on file  Occupational History  . Occupation: SAHM  Tobacco Use  . Smoking status: Never Smoker  . Smokeless tobacco: Never Used  Substance and Sexual Activity  . Alcohol use: Yes    Alcohol/week: 0.6 oz    Types: 1 Glasses of wine per week  .  Drug use: No  . Sexual activity: Yes    Partners: Male    Birth control/protection: Surgical  Other Topics Concern  . Not on file  Social History Narrative   Kayla Reeves lives with husband & 3 kids. 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   Allergies as of 11/03/2017   No Known Allergies     Medication List    as of 11/03/2017 11:55 AM   You have not been prescribed any medications.      No results found for this or any previous visit (from the past 2160 hour(s)).  No results found.   ROS: 14  pt review of systems performed and negative (unless mentioned in an HPI)  Objective: BP (!) 144/90 (BP Location: Left Arm, Patient Position: Sitting, Cuff Size: Normal)   Pulse 91   Temp 98 F (36.7 C) (Oral)   Ht 5' 2"  (1.575 m)   Wt 170 lb 6.4 oz (77.3 kg)   LMP 10/08/2017   SpO2 100%   BMI 31.17 kg/m   Gen: Afebrile. No acute distress. Nontoxic in appearance, well-developed, well-nourished, Caucasian female. Obese. Nervous. HENT: AT. [place]. Bilateral TM visualized and normal in appearance. MMM. Bilateral nares without erythema or swelling. Throat without erythema or exudates. No cough. No hoarseness. Eyes:Pupils Equal Round Reactive to light, Extraocular movements intact,  Conjunctiva without redness, discharge or icterus. Neck/lymp/endocrine: Supple, no lymphadenopathy, no thyromegaly CV: RRR no murmur, no edema, +2/4 P posterior tibialis pulses Chest: CTAB, no wheeze or crackles Abd: Soft. Obese. NTND. BS present. No Masses palpated.  Skin: No rashes, purpura or petechiae.  Neuro:  Normal gait. PERLA. EOMi. Alert. Oriented x3 Cranial nerves II through XII intact. Muscle strength 5/5 bilateral extremity. DTRs equal bilaterally. Psych: Nervous, otherwise Normal affect, dress and demeanor. Normal speech. Normal thought content and judgment. Assessment/plan: Kayla Reeves is a 41 y.o. female present for CPE. Encounter for general adult medical examination with abnormal findings Colonoscopy: No Fhx, screen at 48 Mammogram: Completed  12/10/2016 BI-RADS 0 (left breast), ultrasound completed 12/17/2016 and no abnormalities. Return to normal scheduled yearly mammograms. Family history in half sister. Yearly Mammogram ordered today. Cervical cancer screening: last pap 02/2015:, results: normal, neg HPV, completed by: PCP Immunizations: tdap completed today, Influenza declined (encouraged yearly) Infectious disease screening: HIV completed - Tdap vaccine greater than or equal to 7yo IM Obesity  (BMI 30-39.9) - diet and exercise - Comp Met (CMET) - Lipid panel Vitamin D deficiency Not routinely taking OTC - Vitamin D (25 hydroxy) Breast cancer screening - MM SCREENING BREAST TOMO BILATERAL; Future Screening for deficiency anemia - CBC w/Diff Diabetes mellitus screening - HgB A1c Elevated BP without diagnosis of hypertension - borderline BP X2. Patient is very nervous today because she is due for labs. Her blood pressure was normal last year, therefore we decided to have her follow-up in one month by provider for blood pressure recheck.  - TSH  Return in about 1 year (around 11/03/2018) for CPE. One month for broader appointment on elevated BP  Electronically signed by: Howard Pouch, [place]

## 2017-12-04 ENCOUNTER — Ambulatory Visit: Payer: 59 | Admitting: Family Medicine

## 2017-12-11 ENCOUNTER — Encounter: Payer: Self-pay | Admitting: Family Medicine

## 2017-12-11 ENCOUNTER — Ambulatory Visit: Payer: 59

## 2017-12-11 ENCOUNTER — Ambulatory Visit: Payer: 59 | Admitting: Family Medicine

## 2017-12-11 VITALS — BP 162/92 | HR 89 | Temp 98.1°F | Ht 62.0 in | Wt 171.8 lb

## 2017-12-11 DIAGNOSIS — I10 Essential (primary) hypertension: Secondary | ICD-10-CM | POA: Diagnosis not present

## 2017-12-11 MED ORDER — BLOOD PRESSURE MONITOR/L CUFF MISC: 0 refills | Status: DC

## 2017-12-11 MED ORDER — LISINOPRIL 10 MG PO TABS: 10.0000 mg | ORAL_TABLET | Freq: Every day | ORAL | 0 refills | Status: DC

## 2017-12-11 NOTE — Patient Instructions (Signed)
Start lisinopril once a day. Drink plenty of water and low sodium.  Exercise > 150 min a week are all helpful to bring down blood pressure.  Genetics can also play a strong role in hypertension.    Hypertension Hypertension is another name for high blood pressure. High blood pressure forces your heart to work harder to pump blood. This can cause problems over time. There are two numbers in a blood pressure reading. There is a top number (systolic) over a bottom number (diastolic). It is best to have a blood pressure below 120/80. Healthy choices can help lower your blood pressure. You may need medicine to help lower your blood pressure if:  Your blood pressure cannot be lowered with healthy choices.  Your blood pressure is higher than 130/80.  Follow these instructions at home: Eating and drinking  If directed, follow the DASH eating plan. This diet includes: ? Filling half of your plate at each meal with fruits and vegetables. ? Filling one quarter of your plate at each meal with whole grains. Whole grains include whole wheat pasta, brown rice, and whole grain bread. ? Eating or drinking low-fat dairy products, such as skim milk or low-fat yogurt. ? Filling one quarter of your plate at each meal with low-fat (lean) proteins. Low-fat proteins include fish, skinless chicken, eggs, beans, and tofu. ? Avoiding fatty meat, cured and processed meat, or chicken with skin. ? Avoiding premade or processed food.  Eat less than 1,500 mg of salt (sodium) a day.  Limit alcohol use to no more than 1 drink a day for nonpregnant women and 2 drinks a day for men. One drink equals 12 oz of beer, 5 oz of wine, or 1 oz of hard liquor. Lifestyle  Work with your doctor to stay at a healthy weight or to lose weight. Ask your doctor what the best weight is for you.  Get at least 30 minutes of exercise that causes your heart to beat faster (aerobic exercise) most days of the week. This may include walking,  swimming, or biking.  Get at least 30 minutes of exercise that strengthens your muscles (resistance exercise) at least 3 days a week. This may include lifting weights or pilates.  Do not use any products that contain nicotine or tobacco. This includes cigarettes and e-cigarettes. If you need help quitting, ask your doctor.  Check your blood pressure at home as told by your doctor.  Keep all follow-up visits as told by your doctor. This is important. Medicines  Take over-the-counter and prescription medicines only as told by your doctor. Follow directions carefully.  Do not skip doses of blood pressure medicine. The medicine does not work as well if you skip doses. Skipping doses also puts you at risk for problems.  Ask your doctor about side effects or reactions to medicines that you should watch for. Contact a doctor if:  You think you are having a reaction to the medicine you are taking.  You have headaches that keep coming back (recurring).  You feel dizzy.  You have swelling in your ankles.  You have trouble with your vision. Get help right away if:  You get a very bad headache.  You start to feel confused.  You feel weak or numb.  You feel faint.  You get very bad pain in your: ? Chest. ? Belly (abdomen).  You throw up (vomit) more than once.  You have trouble breathing. Summary  Hypertension is another name for high blood pressure.  Making healthy choices can help lower blood pressure. If your blood pressure cannot be controlled with healthy choices, you may need to take medicine. This information is not intended to replace advice given to you by your health care provider. Make sure you discuss any questions you have with your health care provider. Document Released: 04/08/2008 Document Revised: 09/18/2016 Document Reviewed: 09/18/2016 Elsevier Interactive Patient Education  [employer].

## 2017-12-11 NOTE — Progress Notes (Signed)
Kayla Reeves , 1976-03-05, 42 y.o., female MRN: [id number] Patient Care Team    Relationship Specialty Notifications Start End  Ma Hillock, DO PCP - General Family Medicine  10/10/16   Ma Hillock, DO PCP - Family Medicine Family Medicine  07/05/15     Chief Complaint  Patient presents with  . Follow-up    1 month F/U for elevated bp     Subjective: Pt presents for an OV for follow up on elevated BP on her CPE a few weeks ago. She was felt her BP was elevated on last visit because she was nervous over getting blood work. She has a family h/o HD and stroke. She has not checked her BP in the outpatient setting. She denies headache, dizziness, chest pain or dyspnea.  Depression screen [facility] 2/9 12/11/2017 11/03/2017 11/01/2016  Decreased Interest 0 0 0  Down, Depressed, Hopeless 0 0 0  PHQ - 2 Score 0 0 0    No Known Allergies Social History   Tobacco Use  . Smoking status: Never Smoker  . Smokeless tobacco: Never Used  Substance Use Topics  . Alcohol use: Yes    Alcohol/week: 0.6 oz    Types: 1 Glasses of wine per week   Past Medical History:  Diagnosis Date  . Allergy   . Seasonal allergies    Past Surgical History:  Procedure Laterality Date  . NO PAST SURGERIES     Family History  Problem Relation Age of Onset  . Diabetes Father   . Aneurysm Father        brain  . Stroke Mother 109  . High blood pressure Mother   . Rheum arthritis Mother   . Breast cancer Sister        breast  . COPD Maternal Grandmother   . Stroke Paternal Grandmother   . Heart disease Paternal Grandfather   . Breast cancer Maternal Aunt    Allergies as of 12/11/2017   No Known Allergies     Medication List        Accurate as of 12/11/17 12:32 PM. Always use your most recent med list.          Blood Pressure Monitor/L Cuff Misc Monitor BP once daily   lisinopril 10 MG tablet Commonly known as:  PRINIVIL,ZESTRIL Take 1 tablet (10 mg total) by mouth daily.       All  past medical history, surgical history, allergies, family history, immunizations andmedications were updated in the EMR today and reviewed under the history and medication portions of their EMR.     ROS: Negative, with the exception of above mentioned in HPI   Objective:  BP (!) 162/92   Pulse 89   Temp 98.1 F (36.7 C) (Oral)   Ht 5' 2"  (1.575 m)   Wt 171 lb 12.8 oz (77.9 kg)   LMP 11/13/2017   SpO2 100%   BMI 31.42 kg/m  Body mass index is 31.42 kg/m. Gen: Afebrile. No acute distress. Nontoxic in appearance, well developed, well nourished.  HENT: AT. [place].MMM Eyes: Pupils Equal Round Reactive to light, Extraocular movements intact,  Conjunctiva without redness, discharge or icterus. CV: RRR no murmur, no edema Chest: CTAB, no wheeze or crackles. Good air movement, normal resp effort.  Abd: Soft. NTND. BS present. Neuro:  Normal gait. PERLA. EOMi. Alert. Oriented x3   No exam data present No results found. No results found for this or any previous visit (from the past 24 hour(s)).  Assessment/Plan: Kayla Reeves is a 42 y.o. female present for OV for  Essential hypertension/morbid obesity - Blood pressure remain elevated 2 today. - Start lisinopril 10 mg daily - Low-sodium diet. Increase exercise greater than 150 minutes a week. - Blood Pressure Monitoring (BLOOD PRESSURE MONITOR/L CUFF) MISC; Monitor BP once daily  Dispense: 1 each; Refill: 0 - Follow-up one week, lisinopril will be tapered at that time if needed and BMP collected.   Reviewed expectations re: course of current medical issues.  Discussed self-management of symptoms.  Outlined signs and symptoms indicating need for more acute intervention.  Patient verbalized understanding and all questions were answered.  Patient received an After-Visit Summary.    No orders of the defined types were placed in this encounter.    Note is dictated utilizing voice recognition software. Although note has been proof  read prior to signing, occasional typographical errors still can be missed. If any questions arise, please do not hesitate to call for verification.   electronically signed by:  Howard Pouch, DO  [place]

## 2017-12-12 ENCOUNTER — Ambulatory Visit
Admission: RE | Admit: 2017-12-12 | Discharge: 2017-12-12 | Disposition: A | Discharge status: Home / Self Care | Payer: 59 | Source: Ambulatory Visit | Attending: Family Medicine | Admitting: Family Medicine

## 2017-12-12 DIAGNOSIS — Z1239 Encounter for other screening for malignant neoplasm of breast: Secondary | ICD-10-CM

## 2017-12-18 ENCOUNTER — Ambulatory Visit: Payer: 59 | Admitting: Family Medicine

## 2017-12-18 ENCOUNTER — Encounter: Payer: Self-pay | Admitting: Family Medicine

## 2017-12-18 VITALS — BP 132/83 | HR 75 | Temp 98.2°F | Resp 20 | Ht 62.0 in | Wt 164.2 lb

## 2017-12-18 DIAGNOSIS — I10 Essential (primary) hypertension: Secondary | ICD-10-CM

## 2017-12-18 MED ORDER — LISINOPRIL 10 MG PO TABS: 10.0000 mg | ORAL_TABLET | Freq: Every day | ORAL | 0 refills | Status: DC

## 2017-12-18 NOTE — Progress Notes (Signed)
Kayla Reeves , Dec 13, 1975, 42 y.o., female MRN: [id number] Patient Care Team    Relationship Specialty Notifications Start End  Ma Hillock, DO PCP - General Family Medicine  10/10/16   Ma Hillock, DO PCP - Family Medicine Family Medicine  07/05/15     Chief Complaint  Patient presents with  . Hypertension     Subjective:  Hypertension/morbid obesity: Pt reports compliance with Lisinopril 10 mg QD without side effects, started 1 week ago . Blood pressures ranges at home are 120-135/65-78. Patient denies chest pain, shortness of breath or lower extremity edema. Pt does not take a  daily baby ASA. Pt is not prescribed statin. BMP: 11/03/2017 WNL CBC: 11/03/2017 WNL Lipid: 11/03/2017 WNL TSH: 11/03/2017 WNL Diet: does not routinely watch Exercise: yoga RF: HTN, obesity, FHX HD and stroke   Prior note 12/11/2016: Pt presents for an OV for follow up on elevated BP on her CPE a few weeks ago. She was felt her BP was elevated on last visit because she was nervous over getting blood work. She has a family h/o HD and stroke. She has not checked her BP in the outpatient setting. She denies headache, dizziness, chest pain or dyspnea.  Depression screen [facility] 2/9 12/11/2017 11/03/2017 11/01/2016  Decreased Interest 0 0 0  Down, Depressed, Hopeless 0 0 0  PHQ - 2 Score 0 0 0    No Known Allergies Social History   Tobacco Use  . Smoking status: Never Smoker  . Smokeless tobacco: Never Used  Substance Use Topics  . Alcohol use: Yes    Alcohol/week: 0.6 oz    Types: 1 Glasses of wine per week   Past Medical History:  Diagnosis Date  . Allergy   . Seasonal allergies    Past Surgical History:  Procedure Laterality Date  . NO PAST SURGERIES     Family History  Problem Relation Age of Onset  . Diabetes Father   . Aneurysm Father        brain  . Stroke Mother 30  . High blood pressure Mother   . Rheum arthritis Mother   . Breast cancer Sister        breast  . COPD  Maternal Grandmother   . Stroke Paternal Grandmother   . Heart disease Paternal Grandfather   . Breast cancer Maternal Aunt    Allergies as of 12/18/2017   No Known Allergies     Medication List        Accurate as of 12/18/17  9:07 AM. Always use your most recent med list.          lisinopril 10 MG tablet Commonly known as:  PRINIVIL,ZESTRIL Take 1 tablet (10 mg total) by mouth daily.       All past medical history, surgical history, allergies, family history, immunizations andmedications were updated in the EMR today and reviewed under the history and medication portions of their EMR.     ROS: Negative, with the exception of above mentioned in HPI   Objective:  BP 132/83 (BP Location: Left Arm, Patient Position: Sitting, Cuff Size: Normal)   Pulse 75   Temp 98.2 F (36.8 C)   Resp 20   Ht 5' 2"  (1.575 m)   Wt 164 lb 4 oz (74.5 kg)   LMP 12/12/2017   SpO2 99%   BMI 30.04 kg/m  Body mass index is 30.04 kg/m. Gen: Afebrile. No acute distress. Nontoxic. Pleasant obese female.  HENT: AT. [place].  MMM.  Eyes:Pupils Equal Round Reactive to light, Extraocular movements intact,  Conjunctiva without redness, discharge or icterus. CV: RRR no murmur, no edema, +2/4 P posterior tibialis pulses Chest: CTAB, no wheeze or crackles Neuro:  Normal gait. PERLA. EOMi. Alert. Oriented x3   No exam data present No results found. No results found for this or any previous visit (from the past 24 hour(s)).  Assessment/Plan: Kayla Reeves is a 42 y.o. female present for OV for  Essential hypertension/morbid obesity - blood pressure stable today. Home pressures range from 120-135/65-78 - Continue lisinopril 10 mg QD. Refills provided today #90 - bmp next visit.  - Low-sodium diet. Increase exercise greater than 150 minutes a week. - f/u 3 months with BMP     Reviewed expectations re: course of current medical issues.  Discussed self-management of symptoms.  Outlined signs and  symptoms indicating need for more acute intervention.  Patient verbalized understanding and all questions were answered.  Patient received an After-Visit Summary.    No orders of the defined types were placed in this encounter.    Note is dictated utilizing voice recognition software. Although note has been proof read prior to signing, occasional typographical errors still can be missed. If any questions arise, please do not hesitate to call for verification.   electronically signed by:  Howard Pouch, DO  [place]

## 2017-12-18 NOTE — Patient Instructions (Addendum)
Continue to take lisinopril 10 mg a day. I have refilled this medication for you. Followup in 3 months.  Low sodium, exercise > 150 min a week.     Hypertension Hypertension, commonly called high blood pressure, is when the force of blood pumping through the arteries is too strong. The arteries are the blood vessels that carry blood from the heart throughout the body. Hypertension forces the heart to work harder to pump blood and may cause arteries to become narrow or stiff. Having untreated or uncontrolled hypertension can cause heart attacks, strokes, kidney disease, and other problems. A blood pressure reading consists of a higher number over a lower number. Ideally, your blood pressure should be below 120/80. The first ("top") number is called the systolic pressure. It is a measure of the pressure in your arteries as your heart beats. The second ("bottom") number is called the diastolic pressure. It is a measure of the pressure in your arteries as the heart relaxes. What are the causes? The cause of this condition is not known. What increases the risk? Some risk factors for high blood pressure are under your control. Others are not. Factors you can change  Smoking.  Having type 2 diabetes mellitus, high cholesterol, or both.  Not getting enough exercise or physical activity.  Being overweight.  Having too much fat, sugar, calories, or salt (sodium) in your diet.  Drinking too much alcohol. Factors that are difficult or impossible to change  Having chronic kidney disease.  Having a family history of high blood pressure.  Age. Risk increases with age.  Race. You may be at higher risk if you are African-American.  Gender. Men are at higher risk than women before age 59. After age 59, women are at higher risk than men.  Having obstructive sleep apnea.  Stress. What are the signs or symptoms? Extremely high blood pressure (hypertensive crisis) may  cause:  Headache.  Anxiety.  Shortness of breath.  Nosebleed.  Nausea and vomiting.  Severe chest pain.  Jerky movements you cannot control (seizures).  How is this diagnosed? This condition is diagnosed by measuring your blood pressure while you are seated, with your arm resting on a surface. The cuff of the blood pressure monitor will be placed directly against the skin of your upper arm at the level of your heart. It should be measured at least twice using the same arm. Certain conditions can cause a difference in blood pressure between your right and left arms. Certain factors can cause blood pressure readings to be lower or higher than normal (elevated) for a short period of time:  When your blood pressure is higher when you are in a health care provider's office than when you are at home, this is called white coat hypertension. Most people with this condition do not need medicines.  When your blood pressure is higher at home than when you are in a health care provider's office, this is called masked hypertension. Most people with this condition may need medicines to control blood pressure.  If you have a high blood pressure reading during one visit or you have normal blood pressure with other risk factors:  You may be asked to return on a different day to have your blood pressure checked again.  You may be asked to monitor your blood pressure at home for 1 week or longer.  If you are diagnosed with hypertension, you may have other blood or imaging tests to help your health care provider understand  your overall risk for other conditions. How is this treated? This condition is treated by making healthy lifestyle changes, such as eating healthy foods, exercising more, and reducing your alcohol intake. Your health care provider may prescribe medicine if lifestyle changes are not enough to get your blood pressure under control, and if:  Your systolic blood pressure is above  130.  Your diastolic blood pressure is above 80.  Your personal target blood pressure may vary depending on your medical conditions, your age, and other factors. Follow these instructions at home: Eating and drinking  Eat a diet that is high in fiber and potassium, and low in sodium, added sugar, and fat. An example eating plan is called the DASH (Dietary Approaches to Stop Hypertension) diet. To eat this way: ? Eat plenty of fresh fruits and vegetables. Try to fill half of your plate at each meal with fruits and vegetables. ? Eat whole grains, such as whole wheat pasta, brown rice, or whole grain bread. Fill about one quarter of your plate with whole grains. ? Eat or drink low-fat dairy products, such as skim milk or low-fat yogurt. ? Avoid fatty cuts of meat, processed or cured meats, and poultry with skin. Fill about one quarter of your plate with lean proteins, such as fish, chicken without skin, beans, eggs, and tofu. ? Avoid premade and processed foods. These tend to be higher in sodium, added sugar, and fat.  Reduce your daily sodium intake. Most people with hypertension should eat less than 1,500 mg of sodium a day.  Limit alcohol intake to no more than 1 drink a day for nonpregnant women and 2 drinks a day for men. One drink equals 12 oz of beer, 5 oz of wine, or 1 oz of hard liquor. Lifestyle  Work with your health care provider to maintain a healthy body weight or to lose weight. Ask what an ideal weight is for you.  Get at least 30 minutes of exercise that causes your heart to beat faster (aerobic exercise) most days of the week. Activities may include walking, swimming, or biking.  Include exercise to strengthen your muscles (resistance exercise), such as pilates or lifting weights, as part of your weekly exercise routine. Try to do these types of exercises for 30 minutes at least 3 days a week.  Do not use any products that contain nicotine or tobacco, such as cigarettes and  e-cigarettes. If you need help quitting, ask your health care provider.  Monitor your blood pressure at home as told by your health care provider.  Keep all follow-up visits as told by your health care provider. This is important. Medicines  Take over-the-counter and prescription medicines only as told by your health care provider. Follow directions carefully. Blood pressure medicines must be taken as prescribed.  Do not skip doses of blood pressure medicine. Doing this puts you at risk for problems and can make the medicine less effective.  Ask your health care provider about side effects or reactions to medicines that you should watch for. Contact a health care provider if:  You think you are having a reaction to a medicine you are taking.  You have headaches that keep coming back (recurring).  You feel dizzy.  You have swelling in your ankles.  You have trouble with your vision. Get help right away if:  You develop a severe headache or confusion.  You have unusual weakness or numbness.  You feel faint.  You have severe pain in your chest  or abdomen.  You vomit repeatedly.  You have trouble breathing. Summary  Hypertension is when the force of blood pumping through your arteries is too strong. If this condition is not controlled, it may put you at risk for serious complications.  Your personal target blood pressure may vary depending on your medical conditions, your age, and other factors. For most people, a normal blood pressure is less than 120/80.  Hypertension is treated with lifestyle changes, medicines, or a combination of both. Lifestyle changes include weight loss, eating a healthy, low-sodium diet, exercising more, and limiting alcohol. This information is not intended to replace advice given to you by your health care provider. Make sure you discuss any questions you have with your health care provider. Document Released: 10/21/2005 Document Revised: 09/18/2016  Document Reviewed: 09/18/2016 Elsevier Interactive Patient Education  [employer].

## 2018-03-18 ENCOUNTER — Ambulatory Visit: Payer: 59 | Admitting: Family Medicine

## 2018-03-19 ENCOUNTER — Telehealth: Payer: Self-pay | Admitting: Family Medicine

## 2018-03-19 NOTE — Telephone Encounter (Signed)
Copied from [place] [id number]. Topic: Quick Communication - Rx Refill/Question >> Mar 19, 2018  1:27 PM Bea Graff, NT wrote: Medication: lisinopril (PRINIVIL,ZESTRIL) 10 MG tablet Has the patient contacted their pharmacy? Yes.   (Agent: If no, request that the patient contact the pharmacy for the refill.) Preferred Pharmacy (with phone number or street name): [id number] [place] - [address][phone](Phone) [phone](Fax)   Patient is traveling and left BP meds at home and wants to see if at least 4 can be called in for her to take until she gets home.    Agent: Please be advised that RX refills may take up to 3 business days. We ask that you follow-up with your pharmacy.

## 2018-03-19 NOTE — Telephone Encounter (Signed)
LOV  12/18/17 Dr. Raoul Pitch Last refill 12/18/17 Pt. Out of town - see encounter.

## 2018-03-20 MED ORDER — LISINOPRIL 10 MG PO TABS: 10.0000 mg | ORAL_TABLET | Freq: Every day | ORAL | 0 refills | Status: DC

## 2018-03-20 NOTE — Telephone Encounter (Signed)
Refilled for 10 day supply

## 2018-04-02 ENCOUNTER — Encounter: Payer: Self-pay | Admitting: Family Medicine

## 2018-04-02 ENCOUNTER — Ambulatory Visit: Payer: 59 | Admitting: Family Medicine

## 2018-04-02 VITALS — BP 134/89 | HR 80 | Resp 16 | Ht 62.0 in | Wt 171.0 lb

## 2018-04-02 DIAGNOSIS — I10 Essential (primary) hypertension: Secondary | ICD-10-CM

## 2018-04-02 DIAGNOSIS — E559 Vitamin D deficiency, unspecified: Secondary | ICD-10-CM | POA: Diagnosis not present

## 2018-04-02 LAB — BASIC METABOLIC PANEL
BUN: 13 mg/dL (ref 6–23)
CHLORIDE: 104 meq/L (ref 96–112)
CO2: 24 meq/L (ref 19–32)
CREATININE: 0.72 mg/dL (ref 0.40–1.20)
Calcium: 9.1 mg/dL (ref 8.4–10.5)
GFR: 93.42 mL/min (ref >60.00)
Glucose, Bld: 95 mg/dL (ref 70–99)
POTASSIUM: 4 meq/L (ref 3.5–5.1)
Sodium: 137 mEq/L (ref 135–145)

## 2018-04-02 LAB — VITAMIN D 25 HYDROXY (VIT D DEFICIENCY, FRACTURES): VITD: 24.58 ng/mL — AB (ref 30.00–100.00)

## 2018-04-02 MED ORDER — LISINOPRIL 10 MG PO TABS: 10.0000 mg | ORAL_TABLET | Freq: Every day | ORAL | 1 refills | Status: DC

## 2018-04-02 NOTE — Progress Notes (Signed)
Kayla Reeves , [birth year], 42 y.o., female MRN: [id number] Patient Care Team    Relationship Specialty Notifications Start End  Ma Hillock, DO PCP - General Family Medicine  10/10/16   Ma Hillock, DO PCP - Family Medicine Family Medicine  07/05/15     Chief Complaint  Patient presents with  . Hypertension    follow up     Subjective:  Vitamin D deficiency:  Patient is uncertain what dose of vitamin D she is taking daily.  But she states she is taking 1 daily.  SHe has not had her vitamin D rechecked since being diagnosed in December and being prescribed medication for 3 months. Hypertension/morbid obesity: Pt reports compliance with Lisinopril 10 mg QD without side effects.Blood pressures ranges at home are 120's. Patient denies chest pain, shortness of breath, dizziness or lower extremity edema.  BMP: 11/03/2017 WNL--> collected today CBC: 11/03/2017 WNL Lipid: 11/03/2017 WNL TSH: 11/03/2017 WNL Diet: does not routinely watch Exercise: yoga RF: HTN, obesity, FHX HD and stroke   Prior note 12/11/2016: Pt presents for an OV for follow up on elevated BP on her CPE a few weeks ago. She was felt her BP was elevated on last visit because she was nervous over getting blood work. She has a family h/o HD and stroke. She has not checked her BP in the outpatient setting. She denies headache, dizziness, chest pain or dyspnea.  Depression screen [facility] 2/9 12/11/2017 11/03/2017 11/01/2016  Decreased Interest 0 0 0  Down, Depressed, Hopeless 0 0 0  PHQ - 2 Score 0 0 0    No Known Allergies Social History   Tobacco Use  . Smoking status: Never Smoker  . Smokeless tobacco: Never Used  Substance Use Topics  . Alcohol use: Yes    Alcohol/week: 0.6 oz    Types: 1 Glasses of wine per week   Past Medical History:  Diagnosis Date  . Allergy   . Seasonal allergies    Past Surgical History:  Procedure Laterality Date  . NO PAST SURGERIES     Family History  Problem Relation Age  of Onset  . Diabetes Father   . Aneurysm Father        brain  . Stroke Mother 1  . High blood pressure Mother   . Rheum arthritis Mother   . Breast cancer Sister        breast  . COPD Maternal Grandmother   . Stroke Paternal Grandmother   . Heart disease Paternal Grandfather   . Breast cancer Maternal Aunt    Allergies as of 04/02/2018   No Known Allergies     Medication List        Accurate as of 04/02/18  9:09 AM. Always use your most recent med list.          lisinopril 10 MG tablet Commonly known as:  PRINIVIL,ZESTRIL Take 1 tablet (10 mg total) by mouth daily.       All past medical history, surgical history, allergies, family history, immunizations andmedications were updated in the EMR today and reviewed under the history and medication portions of their EMR.     ROS: Negative, with the exception of above mentioned in HPI   Objective:  BP 134/89 (BP Location: Left Arm, Patient Position: Sitting, Cuff Size: Normal)   Pulse 80   Resp 16   Ht 5' 2"  (1.575 m)   Wt 171 lb (77.6 kg)   SpO2 100%   BMI 31.28  kg/m  Body mass index is 31.28 kg/m. Gen: Afebrile. No acute distress.  Toxic, well-developed, well-nourished, obese, pleasant Caucasian female. HENT: AT. [place].  MMM.  Eyes:Pupils Equal Round Reactive to light, Extraocular movements intact,  Conjunctiva without redness, discharge or icterus. CV: RRR no murmur, no edema, +2/4 P posterior tibialis pulses Chest: CTAB, no wheeze or crackles Abd: Soft. obese. NTND. BS present..  Neuro: Normal gait. PERLA. EOMi. Alert. Oriented x3  Psych: Mildly anxious, otherwise normal affect, dress and demeanor. Normal speech. Normal thought content and judgment..     No exam data present No results found. No results found for this or any previous visit (from the past 24 hour(s)).  Assessment/Plan: Kayla Reeves is a 42 y.o. female present for OV for  Essential hypertension/morbid obesity - Stable today and home  pressures even better.  SHe does have whitecoat syndrome - Continue lisinopril 10 mg QD. Refills provided today #90 - Low-sodium diet. Increase exercise greater than 150 minutes a week. - f/u 6 months  Vitamin D deficiency: -She will check the dose of her vitamin D, so when we call her back we can guide her better on dosage needed. -Repeat vitamin D today.    Reviewed expectations re: course of current medical issues.  Discussed self-management of symptoms.  Outlined signs and symptoms indicating need for more acute intervention.  Patient verbalized understanding and all questions were answered.  Patient received an After-Visit Summary.    No orders of the defined types were placed in this encounter.    Note is dictated utilizing voice recognition software. Although note has been proof read prior to signing, occasional typographical errors still can be missed. If any questions arise, please do not hesitate to call for verification.   electronically signed by:  Howard Pouch, DO  [place]

## 2018-04-02 NOTE — Patient Instructions (Signed)
It was a pleasure to see you today.  Refills on meds sent to mail in pharmacy.   Low sodium and routine exercise.   Please help [place] help you:  We are honored you have chosen [facility] for your Primary Care home. Below you will find basic instructions that you may need to access in the future. Please help [place] help you by reading the instructions, which cover many of the frequent questions we experience.   Prescription refills and request:  -In order to allow more efficient response time, please call your pharmacy for all refills. They will forward the request electronically to [place]. This allows for the quickest possible response. Request left on a nurse line can take longer to refill, since these are checked as time allows between office patients and other phone calls.  - refill request can take up to 3-5 working days to complete.  - If request is sent electronically and request is appropiate, it is usually completed in 1-2 business days.  - all patients will need to be seen routinely for all chronic medical conditions requiring prescription medications (see follow-up below). If you are overdue for follow up on your condition, you will be asked to make an appointment and we will call in enough medication to cover you until your appointment (up to 30 days).  - all controlled substances will require a face to face visit to request/refill.  - if you desire your prescriptions to go through a new pharmacy, and have an active script at original pharmacy, you will need to call your pharmacy and have scripts transferred to new pharmacy. This is completed between the pharmacy locations and not by your provider.    Results: If any images or labs were ordered, it can take up to 1 week to get results depending on the test ordered and the lab/facility running and resulting the test. - Normal or stable results, which do not need further discussion, may be released to your mychart immediately with attached  note to you. A call may not be generated for normal results. Please make certain to sign up for mychart. If you have questions on how to activate your mychart you can call the front office.  - If your results need further discussion, our office will attempt to contact you via phone, and if unable to reach you after 2 attempts, we will release your abnormal result to your mychart with instructions.  - All results will be automatically released in mychart after 1 week.  - Your provider will provide you with explanation and instruction on all relevant material in your results. Please keep in mind, results and labs may appear confusing or abnormal to the untrained eye, but it does not mean they are actually abnormal for you personally. If you have any questions about your results that are not covered, or you desire more detailed explanation than what was provided, you should make an appointment with your provider to do so.   Our office handles many outgoing and incoming calls daily. If we have not contacted you within 1 week about your results, please check your mychart to see if there is a message first and if not, then contact our office.  In helping with this matter, you help decrease call volume, and therefore allow [place] to be able to respond to patients needs more efficiently.   Acute office visits (sick visit):  An acute visit is intended for a new problem and are scheduled in shorter time  slots to allow schedule openings for patients with new problems. This is the appropriate visit to discuss a new problem. Problems will not be addressed by phone call or Echart message. Appointment is needed if requesting treatment. In order to provide you with excellent quality medical care with proper time for you to explain your problem, have an exam and receive treatment with instructions, these appointments should be limited to one new problem per visit. If you experience a new problem, in which you desire to be  addressed, please make an acute office visit, we save openings on the schedule to accommodate you. Please do not save your new problem for any other type of visit, let us take care of it properly and quickly for you.   Follow up visits:  Depending on your condition(s) your provider will need to see you routinely in order to provide you with quality care and prescribe medication(s). Most chronic conditions (Example: hypertension, Diabetes, depression/anxiety... etc), require visits a couple times a year. Your provider will instruct you on proper follow up for your personal medical conditions and history. Please make certain to make follow up appointments for your condition as instructed. Failing to do so could result in lapse in your medication treatment/refills. If you request a refill, and are overdue to be seen on a condition, we will always provide you with a 30 day script (once) to allow you time to schedule.    Medicare wellness (well visit): - we have a wonderful Nurse Maudie Mercury), that will meet with you and provide you will yearly medicare wellness visits. These visits should occur yearly (can not be scheduled less than 1 calendar year apart) and cover preventive health, immunizations, advance directives and screenings you are entitled to yearly through your medicare benefits. Do not miss out on your entitled benefits, this is when medicare will pay for these benefits to be ordered for you.  These are strongly encouraged by your provider and is the appropriate type of visit to make certain you are up to date with all preventive health benefits. If you have not had your medicare wellness exam in the last 12 months, please make certain to schedule one by calling the office and schedule your medicare wellness with Maudie Mercury as soon as possible.   Yearly physical (well visit):  - Adults are recommended to be seen yearly for physicals. Check with your insurance and date of your last physical, most insurances  require one calendar year between physicals. Physicals include all preventive health topics, screenings, medical exam and labs that are appropriate for gender/age and history. You may have fasting labs needed at this visit. This is a well visit (not a sick visit), new problems should not be covered during this visit (see acute visit).  - Pediatric patients are seen more frequently when they are younger. Your provider will advise you on well child visit timing that is appropriate for your their age. - This is not a medicare wellness visit. Medicare wellness exams do not have an exam portion to the visit. Some medicare companies allow for a physical, some do not allow a yearly physical. If your medicare allows a yearly physical you can schedule the medicare wellness with our nurse Maudie Mercury and have your physical with your provider after, on the same day. Please check with insurance for your full benefits.   Late Policy/No Shows:  - all new patients should arrive 15-30 minutes earlier than appointment to allow [place] time  to  obtain all personal  demographics,  insurance information and for you to complete office paperwork. - All established patients should arrive 10-15 minutes earlier than appointment time to update all information and be checked in .  - In our best efforts to run on time, if you are late for your appointment you will be asked to either reschedule or if able, we will work you back into the schedule. There will be a wait time to work you back in the schedule,  depending on availability.  - If you are unable to make it to your appointment as scheduled, please call 24 hours ahead of time to allow [place] to fill the time slot with someone else who needs to be seen. If you do not cancel your appointment ahead of time, you may be charged a no show fee.

## 2018-04-03 ENCOUNTER — Telehealth: Payer: Self-pay | Admitting: Family Medicine

## 2018-04-03 NOTE — Telephone Encounter (Signed)
Please inform patient the following information: Her vitamin D is mildly low, but improved a little. She was uncertain the dose she was taking at home.  I would suggest 1000u daily, if she is already taking that, then 2000u daily. Please update med list.  Other labs normal.

## 2018-04-03 NOTE — Telephone Encounter (Signed)
Spoke with patient reviewed lab results and instructions. Patient verbalized understanding.patient will send message in [facility] chart for her current dosage of Vit D so we may update her chart.

## 2018-10-30 ENCOUNTER — Other Ambulatory Visit: Payer: Self-pay

## 2018-10-30 ENCOUNTER — Telehealth: Payer: Self-pay | Admitting: Family Medicine

## 2018-10-30 MED ORDER — LISINOPRIL 10 MG PO TABS: 10.0000 mg | ORAL_TABLET | Freq: Every day | ORAL | 0 refills | Status: DC

## 2018-10-30 NOTE — Telephone Encounter (Signed)
Pt walked into the office this afternoon requesting a refill on her lisinopril. Has an appt in Jan 2020. Will call in #90 w no refills to pts pharmacy.

## 2018-10-30 NOTE — Telephone Encounter (Signed)
Refilled pt rx w #90 since she does have a follow up appt in Jan.

## 2018-10-30 NOTE — Telephone Encounter (Signed)
Requested Prescriptions  Patient walk in. Requested to get a refill on her blood pressure meds.Lisinipril.  She uses mail order pharmacy. But she is out of meds (today her last day) She has CPE appt on Tuesday 11/03/18 With Dr. Raoul Pitch She is out of refills.  [facility] Call patient for more info and status  Thank you Hinton Dyer

## 2018-11-03 ENCOUNTER — Encounter: Payer: Self-pay | Admitting: Family Medicine

## 2018-11-03 ENCOUNTER — Ambulatory Visit (INDEPENDENT_AMBULATORY_CARE_PROVIDER_SITE_OTHER): Payer: 59 | Admitting: Family Medicine

## 2018-11-03 VITALS — BP 121/80 | HR 87 | Temp 98.2°F | Resp 16 | Ht 62.25 in | Wt 170.0 lb

## 2018-11-03 DIAGNOSIS — Z0000 Encounter for general adult medical examination without abnormal findings: Secondary | ICD-10-CM | POA: Diagnosis not present

## 2018-11-03 DIAGNOSIS — E669 Obesity, unspecified: Secondary | ICD-10-CM

## 2018-11-03 DIAGNOSIS — J019 Acute sinusitis, unspecified: Secondary | ICD-10-CM

## 2018-11-03 DIAGNOSIS — E559 Vitamin D deficiency, unspecified: Secondary | ICD-10-CM | POA: Diagnosis not present

## 2018-11-03 DIAGNOSIS — Z131 Encounter for screening for diabetes mellitus: Secondary | ICD-10-CM | POA: Diagnosis not present

## 2018-11-03 DIAGNOSIS — I10 Essential (primary) hypertension: Secondary | ICD-10-CM | POA: Diagnosis not present

## 2018-11-03 LAB — CBC
HEMATOCRIT: 40.7 % (ref 36.0–46.0)
HEMOGLOBIN: 13.3 g/dL (ref 12.0–15.0)
MCHC: 32.6 g/dL (ref 30.0–36.0)
MCV: 89.8 fl (ref 78.0–100.0)
Platelets: 402 10*3/uL — ABNORMAL HIGH (ref 150.0–400.0)
RBC: 4.54 Mil/uL (ref 3.87–5.11)
RDW: 13.6 % (ref 11.5–15.5)
WBC: 8.4 10*3/uL (ref 4.0–10.5)

## 2018-11-03 LAB — BASIC METABOLIC PANEL
BUN: 11 mg/dL (ref 6–23)
CO2: 25 mEq/L (ref 19–32)
Calcium: 9.7 mg/dL (ref 8.4–10.5)
Chloride: 102 mEq/L (ref 96–112)
Creatinine, Ser: 0.74 mg/dL (ref 0.40–1.20)
GFR: 90.27 mL/min (ref >60.00)
Glucose, Bld: 90 mg/dL (ref 70–99)
POTASSIUM: 4.3 meq/L (ref 3.5–5.1)
SODIUM: 137 meq/L (ref 135–145)

## 2018-11-03 LAB — TSH: TSH: 2.1 u[IU]/mL (ref 0.35–4.50)

## 2018-11-03 LAB — LIPID PANEL
CHOL/HDL RATIO: 3
Cholesterol: 182 mg/dL (ref 0–200)
HDL: 62 mg/dL (ref >39.00)
LDL Cholesterol: 102 mg/dL — ABNORMAL HIGH (ref 0–99)
NONHDL: 120.2
Triglycerides: 92 mg/dL (ref 0.0–149.0)
VLDL: 18.4 mg/dL (ref 0.0–40.0)

## 2018-11-03 LAB — HEMOGLOBIN A1C: Hgb A1c MFr Bld: 5.4 % (ref 4.6–6.5)

## 2018-11-03 LAB — VITAMIN D 25 HYDROXY (VIT D DEFICIENCY, FRACTURES): VITD: 38.06 ng/mL (ref 30.00–100.00)

## 2018-11-03 MED ORDER — LISINOPRIL 10 MG PO TABS: 10.0000 mg | ORAL_TABLET | Freq: Every day | ORAL | 1 refills | Status: DC

## 2018-11-03 MED ORDER — AZITHROMYCIN 250 MG PO TABS: ORAL_TABLET | ORAL | 0 refills | Status: DC

## 2018-11-03 NOTE — Progress Notes (Signed)
Patient ID: Kayla Reeves, female  DOB: 1976-01-24, 42 y.o.   MRN: [id number] Patient Care Team    Relationship Specialty Notifications Start End  Ma Hillock, DO PCP - General Family Medicine  10/10/16   Ma Hillock, DO PCP - Family Medicine Family Medicine  07/05/15     Chief Complaint  Patient presents with  . Annual Exam    pt states that she has been sick w cold like sxs for approx 2wks, still has a lingering cough. Pt is Fasting this morning.    Subjective:  Kayla Reeves is a 42 y.o.  Female  present for CPE. All past medical history, surgical history, allergies, family history, immunizations, medications and social history were updated in the electronic medical record today. All recent labs, ED visits and hospitalizations within the last year were reviewed.  Sinusitis/cough:  She reports 2 weeks of cough. She had a URI symptoms with fever, chills, body aches for 5 days that has resolved. She is still suffering from a cough and sinus congestion.   Health maintenance:  Colonoscopy: No Fhx, screen at 48 Mammogram: Completed  12/12/2017 BI-RADS 1  Cervical cancer screening: last pap 02/2015:, results: normal, neg HPV, completed by: PCP. 3-5 years. establishing with Dr. Sabra Heck next week. Immunizations: tdap UTD 2018, Influenza postponed until after acute illness- nurse visit encouraged in 2 weeks (encouraged yearly) Infectious disease screening: HIV completed DEXA: N/A Assistive device: none Oxygen WUJ:WJXB Patient has a Dental home. Hospitalizations/ED visits: reviewed  Depression screen [facility] 2/9 11/03/2018 04/02/2018 12/11/2017 11/03/2017 11/01/2016  Decreased Interest 0 0 0 0 0  Down, Depressed, Hopeless 0 0 0 0 0  PHQ - 2 Score 0 0 0 0 0   No flowsheet data found.   Immunization History  Administered Date(s) Administered  . Tdap 11/04/2006, 11/03/2017    Past Medical History:  Diagnosis Date  . Allergy   . Seasonal allergies    No Known Allergies Past  Surgical History:  Procedure Laterality Date  . NO PAST SURGERIES     Family History  Problem Relation Age of Onset  . Diabetes Father   . Aneurysm Father        brain  . Stroke Mother 5  . High blood pressure Mother   . Rheum arthritis Mother   . Breast cancer Sister        breast  . COPD Maternal Grandmother   . Stroke Paternal Grandmother   . Heart disease Paternal Grandfather   . Breast cancer Maternal Aunt    Social History   Socioeconomic History  . Marital status: Married    Spouse name: Not on file  . Number of children: 3  . Years of education: [employer]  . Highest education level: Not on file  Occupational History  . Occupation: [employer]  Social Needs  . Financial resource strain: Not on file  . Food insecurity:    Worry: Not on file    Inability: Not on file  . Transportation needs:    Medical: Not on file    Non-medical: Not on file  Tobacco Use  . Smoking status: Never Smoker  . Smokeless tobacco: Never Used  Substance and Sexual Activity  . Alcohol use: Yes    Alcohol/week: 1.0 standard drinks    Types: 1 Glasses of wine per week  . Drug use: No  . Sexual activity: Yes    Partners: Male    Birth control/protection: Surgical  Lifestyle  .  Physical activity:    Days per week: Not on file    Minutes per session: Not on file  . Stress: Not on file  Relationships  . Social connections:    Talks on phone: Not on file    Gets together: Not on file    Attends religious service: Not on file    Active member of club or organization: Not on file    Attends meetings of clubs or organizations: Not on file    Relationship status: Not on file  . Intimate partner violence:    Fear of current or ex partner: Not on file    Emotionally abused: Not on file    Physically abused: Not on file    Forced sexual activity: Not on file  Other Topics Concern  . Not on file  Social History Narrative   Ms Gullickson lives with husband & 3 kids. 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     Allergies as of 11/03/2018   No Known Allergies     Medication List       Accurate as of November 03, 2018  8:47 AM. Always use your most recent med list.        lisinopril 10 MG tablet Commonly known as:  PRINIVIL,ZESTRIL Take 1 tablet (10 mg total) by mouth daily.       All past medical history, surgical history, allergies, family history, immunizations andmedications were updated in the EMR today and reviewed under the history and medication portions of their EMR.     No results found for this or any previous visit (from the past 2160 hour(s)).  Mm Screening Breast Tomo Bilateral  Result Date: 12/12/2017 CLINICAL DATA:  Screening. EXAM: DIGITAL SCREENING BILATERAL MAMMOGRAM WITH TOMO AND CAD COMPARISON:  Previous exam(s). ACR Breast Density Category b: There are scattered areas of fibroglandular density. FINDINGS: There are no findings suspicious for malignancy. Images were processed with CAD. IMPRESSION: No mammographic evidence of malignancy. A result letter of this screening mammogram will be mailed directly to the patient. RECOMMENDATION: Screening mammogram in one year. (Code:[id number]-01Y) BI-RADS CATEGORY  1: Negative. Electronically Signed   By: Curlene Dolphin M.D.   On: 12/12/2017 11:58    ROS: 14 pt review of systems performed and negative (unless mentioned in an HPI)  Objective: BP 121/80 (BP Location: Left Arm, Patient Position: Sitting, Cuff Size: Normal)   Pulse 87   Temp 98.2 F (36.8 C) (Oral)   Resp 16   Ht 5' 2.25" (1.581 m)   Wt 170 lb (77.1 kg)   LMP 10/13/2018 (Exact Date)   SpO2 100%   BMI 30.84 kg/m  Gen: Afebrile. No acute distress. Nontoxic in appearance, well-developed, well-nourished,  Pleasant obese caucasian female.  HENT: AT. [place]. Bilateral TM visualized and normal in appearance, normal external auditory canal. MMM, no oral lesions, adequate dentition. Bilateral nares with severe erythema, drainage present. Throat without erythema, ulcerations or  exudates.  Cough on exam,  hoarseness on exam. Eyes:Pupils Equal Round Reactive to light, Extraocular movements intact,  Conjunctiva without redness, discharge or icterus. Neck/lymp/endocrine: Supple,no lymphadenopathy, no thyromegaly CV: RRR no murmur, no edema, +2/4 P posterior tibialis pulses. no carotid bruits. No JVD. Chest: CTAB, no wheeze, rhonchi or crackles. normal Respiratory effort. good Air movement. Abd: Soft. flat. NTND. BS presetn. no Masses palpated. No hepatosplenomegaly. No rebound tenderness or guarding. Skin: no rashes, purpura or petechiae. Warm and well-perfused. Skin intact. Neuro/Msk:  Normal gait. PERLA. EOMi. Alert. Oriented x3.  Cranial nerves II  through XII intact. Muscle strength 5/5 upper/lower extremity. DTRs equal bilaterally. Psych: Normal affect, dress and demeanor. Normal speech. Normal thought content and judgment.   No exam data present  Assessment/plan: Kayla Reeves is a 42 y.o. female present for CPE. Vitamin D deficiency currently not on supplement.  - Vitamin D (25 hydroxy) Essential hypertension//Obesity (BMI 30-39.9) - stable. Refills provided. Cough sounds like it is from sinus/allergy currently- if not resolved in 6 weeks- will need to try to switch to losartan or other BP med.  - CBC - Lipid panel - lisinopril (PRINIVIL,ZESTRIL) 10 MG tablet; Take 1 tablet (10 mg total) by mouth daily.  Dispense: 90 tablet; Refill: 1 - TSH - Basic Metabolic Panel (BMET) - f/u 6 mos Diabetes mellitus screening - HgB A1c Acute non-recurrent sinusitis, unspecified location Rest, hydrate.  + flonase, mucinex (DM if cough) or nasal saline.  Printed - z-pack. Use if sx worsen over next 2-3 days only.  If cough present it can last up to 6-8 weeks.  Encounter for preventive health examination Patient was encouraged to exercise greater than 150 minutes a week. Patient was encouraged to choose a diet filled with fresh fruits and vegetables, and lean meats. AVS  provided to patient today for education/recommendation on gender specific health and safety maintenance. Colonoscopy: No Fhx, screen at 50 Mammogram: Completed  12/12/2017 BI-RADS 1  Cervical cancer screening: last pap 02/2015:, results: normal, neg HPV, completed by: PCP. 3-5 years. establishing with Dr. Sabra Heck next week. Immunizations: tdap UTD 2018, Influenza postponed until after acute illness- nurse visit encouraged in 2 weeks (encouraged yearly) Infectious disease screening: HIV completed DEXA: N/A  Return in about 1 year (around 11/04/2019) for CPE. 6 mons HTN Nurse visit for flu shot after acute illness  Electronically signed by: Howard Pouch, DO [place]

## 2018-11-03 NOTE — Patient Instructions (Addendum)
Rest, hydrate.  + flonase, mucinex (DM if cough) and nasal saline rinse.  Printed - z-pack. Use if wx worsen over next 2-3 days only.  If cough present it can last up to 6-8 weeks.  Wait on flu shot and make a nurse visit in 2 weeks to receive after acute illness resolved   Health Maintenance, Female Adopting a healthy lifestyle and getting preventive care can go a long way to promote health and wellness. Talk with your health care provider about what schedule of regular examinations is right for you. This is a good chance for you to check in with your provider about disease prevention and staying healthy. In between checkups, there are plenty of things you can do on your own. Experts have done a lot of research about which lifestyle changes and preventive measures are most likely to keep you healthy. Ask your health care provider for more information. Weight and diet Eat a healthy diet  Be sure to include plenty of vegetables, fruits, low-fat dairy products, and lean protein.  Do not eat a lot of foods high in solid fats, added sugars, or salt.  Get regular exercise. This is one of the most important things you can do for your health. ? Most adults should exercise for at least 150 minutes each week. The exercise should increase your heart rate and make you sweat (moderate-intensity exercise). ? Most adults should also do strengthening exercises at least twice a week. This is in addition to the moderate-intensity exercise. Maintain a healthy weight  Body mass index (BMI) is a measurement that can be used to identify possible weight problems. It estimates body fat based on height and weight. Your health care provider can help determine your BMI and help you achieve or maintain a healthy weight.  For females 73 years of age and older: ? A BMI below 18.5 is considered underweight. ? A BMI of 18.5 to 24.9 is normal. ? A BMI of 25 to 29.9 is considered overweight. ? A BMI of 30 and above is  considered obese. Watch levels of cholesterol and blood lipids  You should start having your blood tested for lipids and cholesterol at 44 years of age, then have this test every 5 years.  You may need to have your cholesterol levels checked more often if: ? Your lipid or cholesterol levels are high. ? You are older than 44 years of age. ? You are at high risk for heart disease. Cancer screening Lung Cancer  Lung cancer screening is recommended for adults 45-46 years old who are at high risk for lung cancer because of a history of smoking.  A yearly low-dose CT scan of the lungs is recommended for people who: ? Currently smoke. ? Have quit within the past 15 years. ? Have at least a 30-pack-year history of smoking. A pack year is smoking an average of one pack of cigarettes a day for 1 year.  Yearly screening should continue until it has been 15 years since you quit.  Yearly screening should stop if you develop a health problem that would prevent you from having lung cancer treatment. Breast Cancer  Practice breast self-awareness. This means understanding how your breasts normally appear and feel.  It also means doing regular breast self-exams. Let your health care provider know about any changes, no matter how small.  If you are in your 20s or 30s, you should have a clinical breast exam (CBE) by a health care provider every 1-3 years  as part of a regular health exam.  If you are 48 or older, have a CBE every year. Also consider having a breast X-ray (mammogram) every year.  If you have a family history of breast cancer, talk to your health care provider about genetic screening.  If you are at high risk for breast cancer, talk to your health care provider about having an MRI and a mammogram every year.  Breast cancer gene (BRCA) assessment is recommended for women who have family members with BRCA-related cancers. BRCA-related cancers  include: ? Breast. ? Ovarian. ? Tubal. ? Peritoneal cancers.  Results of the assessment will determine the need for genetic counseling and BRCA1 and BRCA2 testing. Cervical Cancer Your health care provider may recommend that you be screened regularly for cancer of the pelvic organs (ovaries, uterus, and vagina). This screening involves a pelvic examination, including checking for microscopic changes to the surface of your cervix (Pap test). You may be encouraged to have this screening done every 3 years, beginning at age 55.  For women ages 32-65, health care providers may recommend pelvic exams and Pap testing every 3 years, or they may recommend the Pap and pelvic exam, combined with testing for human papilloma virus (HPV), every 5 years. Some types of HPV increase your risk of cervical cancer. Testing for HPV may also be done on women of any age with unclear Pap test results.  Other health care providers may not recommend any screening for nonpregnant women who are considered low risk for pelvic cancer and who do not have symptoms. Ask your health care provider if a screening pelvic exam is right for you.  If you have had past treatment for cervical cancer or a condition that could lead to cancer, you need Pap tests and screening for cancer for at least 20 years after your treatment. If Pap tests have been discontinued, your risk factors (such as having a new sexual partner) need to be reassessed to determine if screening should resume. Some women have medical problems that increase the chance of getting cervical cancer. In these cases, your health care provider may recommend more frequent screening and Pap tests. Colorectal Cancer  This type of cancer can be detected and often prevented.  Routine colorectal cancer screening usually begins at 44 years of age and continues through 44 years of age.  Your health care provider may recommend screening at an earlier age if you have risk factors for  colon cancer.  Your health care provider may also recommend using home test kits to check for hidden blood in the stool.  A small camera at the end of a tube can be used to examine your colon directly (sigmoidoscopy or colonoscopy). This is done to check for the earliest forms of colorectal cancer.  Routine screening usually begins at age 49.  Direct examination of the colon should be repeated every 5-10 years through 44 years of age. However, you may need to be screened more often if early forms of precancerous polyps or small growths are found. Skin Cancer  Check your skin from head to toe regularly.  Tell your health care provider about any new moles or changes in moles, especially if there is a change in a mole's shape or color.  Also tell your health care provider if you have a mole that is larger than the size of a pencil eraser.  Always use sunscreen. Apply sunscreen liberally and repeatedly throughout the day.  Protect yourself by wearing long sleeves,  pants, a wide-brimmed hat, and sunglasses whenever you are outside. Heart disease, diabetes, and high blood pressure  High blood pressure causes heart disease and increases the risk of stroke. High blood pressure is more likely to develop in: ? People who have blood pressure in the high end of the normal range (130-139/85-89 mm Hg). ? People who are overweight or obese. ? People who are African American.  If you are 1-33 years of age, have your blood pressure checked every 3-5 years. If you are 32 years of age or older, have your blood pressure checked every year. You should have your blood pressure measured twice-once when you are at a hospital or clinic, and once when you are not at a hospital or clinic. Record the average of the two measurements. To check your blood pressure when you are not at a hospital or clinic, you can use: ? An automated blood pressure machine at a pharmacy. ? A home blood pressure monitor.  If you are  between 31 years and 57 years old, ask your health care provider if you should take aspirin to prevent strokes.  Have regular diabetes screenings. This involves taking a blood sample to check your fasting blood sugar level. ? If you are at a normal weight and have a low risk for diabetes, have this test once every three years after 44 years of age. ? If you are overweight and have a high risk for diabetes, consider being tested at a younger age or more often. Preventing infection Hepatitis B  If you have a higher risk for hepatitis B, you should be screened for this virus. You are considered at high risk for hepatitis B if: ? You were born in a country where hepatitis B is common. Ask your health care provider which countries are considered high risk. ? Your parents were born in a high-risk country, and you have not been immunized against hepatitis B (hepatitis B vaccine). ? You have HIV or AIDS. ? You use needles to inject street drugs. ? You live with someone who has hepatitis B. ? You have had sex with someone who has hepatitis B. ? You get hemodialysis treatment. ? You take certain medicines for conditions, including cancer, organ transplantation, and autoimmune conditions. Hepatitis C  Blood testing is recommended for: ? Everyone born from [birth year] through 1967. ? Anyone with known risk factors for hepatitis C. Sexually transmitted infections (STIs)  You should be screened for sexually transmitted infections (STIs) including gonorrhea and chlamydia if: ? You are sexually active and are younger than 44 years of age. ? You are older than 44 years of age and your health care provider tells you that you are at risk for this type of infection. ? Your sexual activity has changed since you were last screened and you are at an increased risk for chlamydia or gonorrhea. Ask your health care provider if you are at risk.  If you do not have HIV, but are at risk, it may be recommended that you take  a prescription medicine daily to prevent HIV infection. This is called pre-exposure prophylaxis (PrEP). You are considered at risk if: ? You are sexually active and do not regularly use condoms or know the HIV status of your partner(s). ? You take drugs by injection. ? You are sexually active with a partner who has HIV. Talk with your health care provider about whether you are at high risk of being infected with HIV. If you choose to begin PrEP,  you should first be tested for HIV. You should then be tested every 3 months for as long as you are taking PrEP. Pregnancy  If you are premenopausal and you may become pregnant, ask your health care provider about preconception counseling.  If you may become pregnant, take 400 to 800 micrograms (mcg) of folic acid every day.  If you want to prevent pregnancy, talk to your health care provider about birth control (contraception). Osteoporosis and menopause  Osteoporosis is a disease in which the bones lose minerals and strength with aging. This can result in serious bone fractures. Your risk for osteoporosis can be identified using a bone density scan.  If you are 71 years of age or older, or if you are at risk for osteoporosis and fractures, ask your health care provider if you should be screened.  Ask your health care provider whether you should take a calcium or vitamin D supplement to lower your risk for osteoporosis.  Menopause may have certain physical symptoms and risks.  Hormone replacement therapy may reduce some of these symptoms and risks. Talk to your health care provider about whether hormone replacement therapy is right for you. Follow these instructions at home:  Schedule regular health, dental, and eye exams.  Stay current with your immunizations.  Do not use any tobacco products including cigarettes, chewing tobacco, or electronic cigarettes.  If you are pregnant, do not drink alcohol.  If you are breastfeeding, limit how  much and how often you drink alcohol.  Limit alcohol intake to no more than 1 drink per day for nonpregnant women. One drink equals 12 ounces of beer, 5 ounces of wine, or 1 ounces of hard liquor.  Do not use street drugs.  Do not share needles.  Ask your health care provider for help if you need support or information about quitting drugs.  Tell your health care provider if you often feel depressed.  Tell your health care provider if you have ever been abused or do not feel safe at home. This information is not intended to replace advice given to you by your health care provider. Make sure you discuss any questions you have with your health care provider. Document Released: 05/06/2011 Document Revised: 03/28/2016 Document Reviewed: 07/25/2015 Elsevier Interactive Patient Education  2019 [employer].

## 2018-11-09 ENCOUNTER — Encounter: Payer: Self-pay | Admitting: Obstetrics & Gynecology

## 2018-11-12 ENCOUNTER — Encounter: Payer: Self-pay | Admitting: Obstetrics & Gynecology

## 2018-11-12 ENCOUNTER — Other Ambulatory Visit (HOSPITAL_COMMUNITY)
Admission: RE | Admit: 2018-11-12 | Discharge: 2018-11-12 | Disposition: A | Discharge status: Home / Self Care | Payer: 59 | Source: Ambulatory Visit | Attending: Obstetrics & Gynecology | Admitting: Obstetrics & Gynecology

## 2018-11-12 ENCOUNTER — Ambulatory Visit: Payer: 59 | Admitting: Obstetrics & Gynecology

## 2018-11-12 ENCOUNTER — Other Ambulatory Visit: Payer: Self-pay

## 2018-11-12 VITALS — BP 132/84 | HR 72 | Resp 16 | Ht 62.0 in | Wt 170.0 lb

## 2018-11-12 DIAGNOSIS — Z124 Encounter for screening for malignant neoplasm of cervix: Secondary | ICD-10-CM | POA: Insufficient documentation

## 2018-11-12 DIAGNOSIS — Z01419 Encounter for gynecological examination (general) (routine) without abnormal findings: Secondary | ICD-10-CM

## 2018-11-12 MED ORDER — HYDROCOD POLST-CPM POLST ER 10-8 MG/5ML PO SUER: 5.0000 mL | Freq: Two times a day (BID) | ORAL | 0 refills | Status: DC | PRN

## 2018-11-12 MED ORDER — BENZONATATE 100 MG PO CAPS: 100.0000 mg | ORAL_CAPSULE | Freq: Three times a day (TID) | ORAL | 0 refills | Status: DC | PRN

## 2018-11-12 NOTE — Progress Notes (Signed)
43 y.o. G50P3003 Married [place] female here for new patient annual exam.  Was a former patient.  Cycles are regular and last about 7 days.  First three days are heavy.  This is manageable for her.   Husband's had a vasectomy.     Is on zpak that she started on day 3.  She's having coughing.  She's taking Robitussin and mucinex.     Patient's last menstrual period was 10/13/2018 (exact date).          Sexually active: Yes.    The current method of family planning is vasectomy.    Exercising: Yes.    walking, hiking Smoker:  no  Health Maintenance: Pap:  02/06/15 Neg. HR HPV:neg  History of abnormal Pap:  no MMG:  12/12/17 BIRADS1:Neg  Colonoscopy:  Never BMD:   Never TDaP:  11/03/17 Screening Labs: PCP.  This was done on 10/24/18.     reports that she has never smoked. She has never used smokeless tobacco. She reports current alcohol use of about 1.0 - 2.0 standard drinks of alcohol per week. She reports that she does not use drugs.  Past Medical History:  Diagnosis Date  . Hypertension   . Seasonal allergies   . Urinary incontinence     Past Surgical History:  Procedure Laterality Date  . NO PAST SURGERIES      Current Outpatient Medications  Medication Sig Dispense Refill  . azithromycin (ZITHROMAX) 250 MG tablet 500 mg day 1, then 250 mg QD 6 tablet 0  . lisinopril (PRINIVIL,ZESTRIL) 10 MG tablet Take 1 tablet (10 mg total) by mouth daily. 90 tablet 1  . Vitamin D, Cholecalciferol, 50 MCG (2000 UT) CAPS Take by mouth daily.     No current facility-administered medications for this visit.     Family History  Problem Relation Age of Onset  . Diabetes Father   . Aneurysm Father        brain  . Polycythemia Father   . Stroke Mother 30  . High blood pressure Mother   . Rheum arthritis Mother   . Hypertension Mother   . Mitral valve prolapse Mother   . Breast cancer Sister        breast  . COPD Maternal Grandmother   . Stroke Paternal Grandmother   . Heart disease  Paternal Grandfather   . Breast cancer Maternal Aunt     Review of Systems  All other systems reviewed and are negative.   Exam:   BP 132/84 (BP Location: Left Arm, Patient Position: Sitting, Cuff Size: Normal)   Pulse 72   Resp 16   Ht 5' 2"  (1.575 m)   Wt 170 lb (77.1 kg)   LMP 10/13/2018 (Exact Date)   BMI 31.09 kg/m   Height: 5' 2"  (157.5 cm)  Ht Readings from Last 3 Encounters:  11/12/18 5' 2"  (1.575 m)  11/03/18 5' 2.25" (1.581 m)  04/02/18 5' 2"  (1.575 m)    General appearance: alert, cooperative and appears stated age Head: Normocephalic, without obvious abnormality, atraumatic Neck: no adenopathy, supple, symmetrical, trachea midline and thyroid normal to inspection and palpation Lungs: clear to auscultation bilaterally Breasts: normal appearance, no masses or tenderness Heart: regular rate and rhythm Abdomen: soft, non-tender; bowel sounds normal; no masses,  no organomegaly Extremities: extremities normal, atraumatic, no cyanosis or edema Skin: Skin color, texture, turgor normal. No rashes or lesions Lymph nodes: Cervical, supraclavicular, and axillary nodes normal. No abnormal inguinal nodes palpated Neurologic: Grossly normal  Pelvic: External genitalia:  no lesions              Urethra:  normal appearing urethra with no masses, tenderness or lesions              Bartholins and Skenes: normal                 Vagina: normal appearing vagina with normal color and discharge, no lesions              Cervix: no lesions              Pap taken: Yes.   Bimanual Exam:  Uterus:  normal size, contour, position, consistency, mobility, non-tender              Adnexa: normal adnexa and no mass, fullness, tenderness               Rectovaginal: Confirms               Anus:  normal sphincter tone, no lesions  Chaperone was present for exam.  A:  Well Woman with normal exam H/O vasectomy for contraception Hypertension URI, on zpak, with significant cough  P:    Mammogram guidelines reviewed.  UTD pap smear with HR HPV obtained today Lab work done 11/03/18 Tessalon 147m TID prn.  #45/0RF Tussionex 534mpo bid prn up to 5 days.  #1405mRF. Return annually or prn

## 2018-11-13 LAB — CYTOLOGY - PAP
DIAGNOSIS: NEGATIVE
HPV: NOT DETECTED

## 2019-02-25 ENCOUNTER — Ambulatory Visit (INDEPENDENT_AMBULATORY_CARE_PROVIDER_SITE_OTHER): Payer: 59 | Admitting: Family Medicine

## 2019-02-25 ENCOUNTER — Encounter: Payer: Self-pay | Admitting: Family Medicine

## 2019-02-25 ENCOUNTER — Ambulatory Visit: Payer: 59 | Admitting: Family Medicine

## 2019-02-25 ENCOUNTER — Other Ambulatory Visit: Payer: Self-pay

## 2019-02-25 VITALS — Temp 98.3°F | Ht 62.0 in

## 2019-02-25 DIAGNOSIS — J029 Acute pharyngitis, unspecified: Secondary | ICD-10-CM

## 2019-02-25 DIAGNOSIS — R05 Cough: Secondary | ICD-10-CM | POA: Diagnosis not present

## 2019-02-25 DIAGNOSIS — R059 Cough, unspecified: Secondary | ICD-10-CM

## 2019-02-25 DIAGNOSIS — Z7189 Other specified counseling: Secondary | ICD-10-CM | POA: Diagnosis not present

## 2019-02-25 NOTE — Patient Instructions (Signed)
Start zyretc before bed.  Continue flonase.  Hydrate- at least 80-100 ounces of water a day.  Over the counter care for symptoms. Such as mucinex DM, tylenol or NSAIDS. Cough drops etc.  Quarantine for 14 days. You can return to work after 14 days of start of symptoms AND 3 days without fever.  No testing is recommended for anyone with COVID 19 like symptoms unless condition is worsening and requiring hospitalization.  If symptoms worsen- shortness of breath occurs--> Go to [facility].  ] Stay home except to get medical care You should restrict activities outside your home, except for getting medical care. Do not go to work, school, or public areas, and do not use public transportation or taxis.  Call ahead before visiting your doctor Before your medical appointment, call the healthcare provider and tell them that you have, or are being evaluated for, COVID-19 infection. This will help the healthcare provider's office take steps to keep other people from getting infected. Ask your healthcare provider to call the local or state health department.  Monitor your symptoms Seek prompt medical attention if your illness is worsening (e.g., difficulty breathing). Before going to your medical appointment, call the healthcare provider and tell them that you have, or are being evaluated for, COVID-19 infection. Ask your healthcare provider to call the local or state health department.  Wear a facemask You should wear a facemask that covers your nose and mouth when you are in the same room with other people and when you visit a healthcare provider. People who live with or visit you should also wear a facemask while they are in the same room with you.  Separate yourself from other people in your home As much as possible, you should stay in a different room from other people in your home. Also, you should use a separate bathroom, if available.  Avoid sharing household items You should not share  dishes, drinking glasses, cups, eating utensils, towels, bedding, or other items with other people in your home. After using these items, you should wash them thoroughly with soap and water.  Cover your coughs and sneezes Cover your mouth and nose with a tissue when you cough or sneeze, or you can cough or sneeze into your sleeve. Throw used tissues in a lined trash can, and immediately wash your hands with soap and water for at least 20 seconds or use an alcohol-based hand rub.  Wash your [facility] your hands often and thoroughly with soap and water for at least 20 seconds. You can use an alcohol-based hand sanitizer if soap and water are not available and if your hands are not visibly dirty. Avoid touching your eyes, nose, and mouth with unwashed hands.   Do not share dishes, glasses, or other household items   Avoid sharing household items. You should not share dishes, drinking glasses, cups, eating utensils, towels, bedding, or other items with a patient who is confirmed to have, or being evaluated for, COVID-19 infection.  After the person uses these items, you should wash them thoroughly with soap and water.  Wash laundry thoroughly  Immediately remove and wash clothes or bedding that have blood, body fluids, and/or secretions or excretions, such as sweat, saliva, sputum, nasal mucus, vomit, urine, or feces, on them.  Wear gloves when handling laundry   Read and follow directions on labels of laundry or clothing items and detergent. In general, wash and dry with the warmest temperatures recommended on the label.  Clean  all areas   Clean all touchable surfaces, such as counters, tabletops, doorknobs, bathroom fixtures, toilets, phones, keyboards, tablets, and bedside tables, every day. Also, clean any surfaces that may have blood, body fluids, and/or secretions or excretions on them.  Wear gloves when cleaning surfaces the patient has come in contact with.  Use a diluted  bleach solution (e.g., dilute bleach with 1 part bleach and 10 parts water) or a household disinfectant with a label that says EPA-registered for coronaviruses. To make a bleach solution at home, add 1 tablespoon of bleach to 1 quart (4 cups) of water. For a larger supply, add  cup of bleach to 1 gallon (16 cups) of water.  Read labels of cleaning products and follow recommendations provided on product labels. Labels contain instructions for safe and effective use of the cleaning product including precautions you should take when applying the product, such as wearing gloves or eye protection and making sure you have good ventilation during use of the product.  Remove gloves and wash hands immediately after cleaning.   ? This guidance is subject to change. For the most up-to-date guidance from [facility], please refer to their website: YouBlogs.pl

## 2019-02-25 NOTE — Progress Notes (Signed)
VIRTUAL VISIT VIA VIDEO  I connected with Kirke Corin on 02/25/19 at  9:40 AM EDT by a video enabled telemedicine application and verified that I am speaking with the correct person using two identifiers. Location patient: Home Location provider: [facility], Office Persons participating in the virtual visit: Patient, Dr. Raoul Pitch and R.Baker, LPN  I discussed the limitations of evaluation and management by telemedicine and the availability of in person appointments. The patient expressed understanding and agreed to proceed.   SUBJECTIVE Chief Complaint  Patient presents with  . Cough    Dry cough x1 day. Pt woke up yesterday with sore throat and cough. 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 Denies SOB or fever. Pt is able to take Tempature but no other vital signs  . Sore Throat    HPI: Cough/sore throat:  Kayla Reeves is a 43 y.o. female present with sore throat and dry cough of 1 day duration.  Also endorses a headache.  Eyes any fever, chills, nausea, vomit, shortness of breath or rash.  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  The mother of the children are obviously also concerned.  She states she has been staying at home with the exceptions of going to work in [employer].  Has no known COVID-19 exposure.  He has been using her Flonase nasal spray.  He does have a history of allergies. ROS: See pertinent positives and negatives per HPI.  Patient Active Problem List   Diagnosis Date Noted  . Essential hypertension 12/11/2017  . Vitamin D deficiency 11/05/2016  . Obesity (BMI 30-39.9) 02/06/2015    Social History   Tobacco Use  . Smoking status: Never Smoker  . Smokeless tobacco: Never Used  Substance Use Topics  . Alcohol use: Yes    Alcohol/week: 1.0 - 2.0 standard drinks    Types: 1 - 2 Glasses of wine per week    Current Outpatient Medications:  .   lisinopril (PRINIVIL,ZESTRIL) 10 MG tablet, Take 1 tablet (10 mg total) by mouth daily., Disp: 90 tablet, Rfl: 1 .  Multiple Vitamins-Minerals (MULTIVITAMIN ADULT PO), Take 1 tablet by mouth daily., Disp: , Rfl:  .  Vitamin D, Cholecalciferol, 50 MCG (2000 UT) CAPS, Take by mouth daily., Disp: , Rfl:   No Known Allergies  OBJECTIVE: Temp 98.3 F (36.8 C) (Tympanic)   Ht 5' 2"  (1.575 m)   LMP 02/15/2019 (Exact Date)   BMI 31.09 kg/m  Gen: No acute distress. Nontoxic in appearance.  HENT: AT. [place].  MMM.  Eyes:Pupils Equal Round Reactive to light, Extraocular movements intact,  Conjunctiva without redness, discharge or icterus. CV: no edema Chest: Cough present. No shortness of breath,  Skin: no rashes, purpura or petechiae.  Neuro:  Alert. Oriented x3  Psych: Normal affect, dress and demeanor. Normal speech. Normal thought content and judgment.   ASSESSMENT AND PLAN: Kirk Sampley is a 43 y.o. female present for  Educated About Covid-19 Virus Infection/Sore throat/Cough -Suspect allergies as cause, cannot rule out COVID-19 given current recommendations of not testing mild symptomatic patients.  Patient was encouraged to monitor at home using OTC medications and allergy regimen, for 14 days.  Home quarantine 14 days. Rest, hydrate.  Continue Flonase nasal spray and start Mucinex DM.  Start Zyrtec nightly, and if unable to tolerate Zyrtec second line recommendation would be Allegra daily. COVID-19 isolation recommendations/education  were provided to her today verbally and via AVS. If cough present it can last up to 6-8 weeks.  F/U 2 weeks of not improved or needing a return to work note.  Patient is aware if her symptoms worsen she becomes febrile or short of breath, she is to be seen immediately with the preferable location is [facility] emergency room.  Patient is in agreement with plan.  Howard Pouch, DO 02/25/2019

## 2019-04-19 ENCOUNTER — Other Ambulatory Visit: Payer: Self-pay | Admitting: Family Medicine

## 2019-04-19 DIAGNOSIS — I10 Essential (primary) hypertension: Secondary | ICD-10-CM

## 2019-04-19 NOTE — Telephone Encounter (Signed)
Please call patient. I have received refill request for her blood pressure medication and pt is due for 6 mos follow up. Please schedule ASAP before patient runs out of medication.  Need her to have her medication in her system when she presents for an appointment so that we know if her treatment is effective.

## 2019-04-20 NOTE — Telephone Encounter (Signed)
Pt was called and message was left detailed message letting pt know she was due for [facility] appt and to call and schedule this appt and to let us know if she had enough medication to last until that appt

## 2019-06-04 ENCOUNTER — Other Ambulatory Visit: Payer: Self-pay

## 2019-06-04 ENCOUNTER — Encounter: Payer: Self-pay | Admitting: Family Medicine

## 2019-06-04 ENCOUNTER — Ambulatory Visit: Payer: 59 | Admitting: Family Medicine

## 2019-06-04 VITALS — BP 132/84 | HR 85 | Temp 98.3°F | Resp 16 | Ht 62.0 in | Wt 175.0 lb

## 2019-06-04 DIAGNOSIS — I10 Essential (primary) hypertension: Secondary | ICD-10-CM

## 2019-06-04 DIAGNOSIS — E669 Obesity, unspecified: Secondary | ICD-10-CM

## 2019-06-04 MED ORDER — LISINOPRIL 10 MG PO TABS: 10.0000 mg | ORAL_TABLET | Freq: Every day | ORAL | 1 refills | Status: DC

## 2019-06-04 NOTE — Progress Notes (Signed)
Kayla Reeves , 09/18/1976, 43 y.o., female MRN: [id number] Patient Care Team    Relationship Specialty Notifications Start End  Ma Hillock, DO PCP - General Family Medicine  11/10/18   Ma Hillock, DO PCP - Family Medicine Family Medicine  07/05/15   Megan Salon, MD Consulting Physician Gynecology  11/03/18     Chief Complaint  Patient presents with  . Hypertension    Pt is checking BP at home randomly and says it is WNL, 125-127/ 80's.      Subjective: Kayla Reeves is a 43 y.o. female present for [facility] followup Hypertension/morbid obesity: Pt reports compliance with Lisinopril 10 mg QD without side effects.Blood pressures ranges at home are 120's. Patient denies chest pain, shortness of breath, dizziness or lower extremity edema.   Labs UTD 10/2018 Diet: does not routinely watch Exercise: yoga RF: HTN, obesity, FHX HD and stroke  Prior note 12/11/2016: Pt presents for an OV for follow up on elevated BP on her CPE a few weeks ago. She was felt her BP was elevated on last visit because she was nervous over getting blood work. She has a family h/o HD and stroke. She has not checked her BP in the outpatient setting. She denies headache, dizziness, chest pain or dyspnea.  Depression screen [facility] 2/9 11/03/2018 04/02/2018 12/11/2017 11/03/2017 11/01/2016  Decreased Interest 0 0 0 0 0  Down, Depressed, Hopeless 0 0 0 0 0  PHQ - 2 Score 0 0 0 0 0    No Known Allergies Social History   Tobacco Use  . Smoking status: Never Smoker  . Smokeless tobacco: Never Used  Substance Use Topics  . Alcohol use: Yes    Alcohol/week: 1.0 - 2.0 standard drinks    Types: 1 - 2 Glasses of wine per week   Past Medical History:  Diagnosis Date  . Hypertension   . Seasonal allergies   . Urinary incontinence    Past Surgical History:  Procedure Laterality Date  . NO PAST SURGERIES     Family History  Problem Relation Age of Onset  . Diabetes Father   . Aneurysm Father        brain  .  Polycythemia Father   . Stroke Mother 31  . High blood pressure Mother   . Rheum arthritis Mother   . Hypertension Mother   . Mitral valve prolapse Mother   . Breast cancer Sister        breast  . COPD Maternal Grandmother   . Stroke Paternal Grandmother   . Heart disease Paternal Grandfather   . Breast cancer Maternal Aunt    Allergies as of 06/04/2019   No Known Allergies     Medication List       Accurate as of June 04, 2019  3:03 PM. If you have any questions, ask your nurse or doctor.        lisinopril 10 MG tablet Commonly known as: ZESTRIL Take 1 tablet (10 mg total) by mouth daily.   MULTIVITAMIN ADULT PO Take 1 tablet by mouth daily.   Vitamin D (Cholecalciferol) 50 MCG (2000 UT) Caps Take by mouth daily.       All past medical history, surgical history, allergies, family history, immunizations andmedications were updated in the EMR today and reviewed under the history and medication portions of their EMR.     ROS: Negative, with the exception of above mentioned in HPI   Objective:  BP 132/84 (BP Location:  Left Arm, Patient Position: Sitting, Cuff Size: Normal)   Pulse 85   Temp 98.3 F (36.8 C) (Temporal)   Resp 16   Ht 5' 2"  (1.575 m)   Wt 175 lb (79.4 kg)   LMP 05/24/2019 (Exact Date)   SpO2 100%   BMI 32.01 kg/m  Body mass index is 32.01 kg/m. Gen: Afebrile. No acute distress. Nontoxic in appearance. Obese.Pleasant female.  HENT: AT. [place].  MMM.  Eyes:Pupils Equal Round Reactive to light, Extraocular movements intact,  Conjunctiva without redness, discharge or icterus. Neck/lymp/endocrine: Supple,no lymphadenopathy, no thyromegaly CV: RRR no murmur, no edema, +2/4 P posterior tibialis pulses Chest: CTAB, no wheeze or crackles Skin: no rashes, purpura or petechiae.  Neuro:  Normal gait. PERLA. EOMi. Alert. Oriented x3 Psych: Normal affect, dress and demeanor. Normal speech. Normal thought content and judgment.    No exam data present No  results found. No results found for this or any previous visit (from the past 24 hour(s)).  Assessment/Plan: Kayla Reeves is a 43 y.o. female present for OV for  Essential hypertension/morbid obesity - Stable. home pressures even better.  SHe does have whitecoat syndrome - Continue lisinopril 10 mg QD. Refills provided today #90 - Low-sodium diet. Increase exercise greater than 150 minutes a week. - f/u mid- jan 2021 for CPE with labs that visit.     Reviewed expectations re: course of current medical issues.  Discussed self-management of symptoms.  Outlined signs and symptoms indicating need for more acute intervention.  Patient verbalized understanding and all questions were answered.  Patient received an After-Visit Summary.    No orders of the defined types were placed in this encounter.   > 15 minutes spent with patient, > 50% of that time face to face  Note is dictated utilizing voice recognition software. Although note has been proof read prior to signing, occasional typographical errors still can be missed. If any questions arise, please do not hesitate to call for verification.   electronically signed by:  Howard Pouch, DO  [place]

## 2019-06-04 NOTE — Patient Instructions (Addendum)
Nice to see you today.  F/u about 6 months- which will be your physical. They will call you  To schedule within the next few days.    Low sodium diet. Exercise > 150 min/week

## 2019-10-25 ENCOUNTER — Other Ambulatory Visit: Payer: Self-pay | Admitting: Family Medicine

## 2019-10-25 DIAGNOSIS — Z1231 Encounter for screening mammogram for malignant neoplasm of breast: Secondary | ICD-10-CM

## 2019-10-26 ENCOUNTER — Ambulatory Visit
Admission: RE | Admit: 2019-10-26 | Discharge: 2019-10-26 | Disposition: A | Discharge status: Home / Self Care | Payer: 59 | Source: Ambulatory Visit | Attending: Family Medicine | Admitting: Family Medicine

## 2019-10-26 ENCOUNTER — Other Ambulatory Visit: Payer: Self-pay

## 2019-10-26 DIAGNOSIS — Z1231 Encounter for screening mammogram for malignant neoplasm of breast: Secondary | ICD-10-CM

## 2019-10-27 ENCOUNTER — Other Ambulatory Visit: Payer: Self-pay | Admitting: Family Medicine

## 2019-10-27 DIAGNOSIS — R928 Other abnormal and inconclusive findings on diagnostic imaging of breast: Secondary | ICD-10-CM

## 2019-11-04 ENCOUNTER — Ambulatory Visit: Payer: 59

## 2019-11-04 ENCOUNTER — Ambulatory Visit
Admission: RE | Admit: 2019-11-04 | Discharge: 2019-11-04 | Disposition: A | Discharge status: Home / Self Care | Payer: 59 | Source: Ambulatory Visit | Attending: Family Medicine | Admitting: Family Medicine

## 2019-11-04 ENCOUNTER — Other Ambulatory Visit: Payer: Self-pay

## 2019-11-04 ENCOUNTER — Encounter: Payer: 59 | Admitting: Family Medicine

## 2019-11-04 DIAGNOSIS — R928 Other abnormal and inconclusive findings on diagnostic imaging of breast: Secondary | ICD-10-CM

## 2019-11-13 ENCOUNTER — Other Ambulatory Visit: Payer: Self-pay | Admitting: Family Medicine

## 2019-11-13 DIAGNOSIS — I10 Essential (primary) hypertension: Secondary | ICD-10-CM

## 2019-11-15 NOTE — Telephone Encounter (Signed)
Medication refilled on 06/04/19 with a 90 day supply with #1 Refill, has OV on 11/19/19 should have enough to last until next appt

## 2019-11-19 ENCOUNTER — Encounter: Payer: Self-pay | Admitting: Family Medicine

## 2019-11-19 ENCOUNTER — Ambulatory Visit (INDEPENDENT_AMBULATORY_CARE_PROVIDER_SITE_OTHER): Payer: 59 | Admitting: Family Medicine

## 2019-11-19 ENCOUNTER — Other Ambulatory Visit: Payer: Self-pay

## 2019-11-19 VITALS — BP 135/85 | HR 80 | Temp 98.6°F | Resp 17 | Ht 62.25 in | Wt 170.0 lb

## 2019-11-19 DIAGNOSIS — I10 Essential (primary) hypertension: Secondary | ICD-10-CM

## 2019-11-19 DIAGNOSIS — E559 Vitamin D deficiency, unspecified: Secondary | ICD-10-CM

## 2019-11-19 DIAGNOSIS — Z0000 Encounter for general adult medical examination without abnormal findings: Secondary | ICD-10-CM

## 2019-11-19 DIAGNOSIS — Z1211 Encounter for screening for malignant neoplasm of colon: Secondary | ICD-10-CM | POA: Diagnosis not present

## 2019-11-19 DIAGNOSIS — Z131 Encounter for screening for diabetes mellitus: Secondary | ICD-10-CM

## 2019-11-19 DIAGNOSIS — Z23 Encounter for immunization: Secondary | ICD-10-CM

## 2019-11-19 DIAGNOSIS — E669 Obesity, unspecified: Secondary | ICD-10-CM | POA: Diagnosis not present

## 2019-11-19 LAB — COMPREHENSIVE METABOLIC PANEL
ALT: 19 U/L (ref 0–35)
AST: 20 U/L (ref 0–37)
Albumin: 4.4 g/dL (ref 3.5–5.2)
Alkaline Phosphatase: 56 U/L (ref 39–117)
BUN: 10 mg/dL (ref 6–23)
CO2: 24 mEq/L (ref 19–32)
Calcium: 9.3 mg/dL (ref 8.4–10.5)
Chloride: 104 mEq/L (ref 96–112)
Creatinine, Ser: 0.73 mg/dL (ref 0.40–1.20)
GFR: 85.88 mL/min (ref >60.00)
Glucose, Bld: 93 mg/dL (ref 70–99)
Potassium: 4.4 mEq/L (ref 3.5–5.1)
Sodium: 138 mEq/L (ref 135–145)
Total Bilirubin: 0.5 mg/dL (ref 0.2–1.2)
Total Protein: 7.7 g/dL (ref 6.0–8.3)

## 2019-11-19 LAB — LIPID PANEL
Cholesterol: 180 mg/dL (ref 0–200)
HDL: 64 mg/dL (ref >39.00)
LDL Cholesterol: 101 mg/dL — ABNORMAL HIGH (ref 0–99)
NonHDL: 115.62
Total CHOL/HDL Ratio: 3
Triglycerides: 75 mg/dL (ref 0.0–149.0)
VLDL: 15 mg/dL (ref 0.0–40.0)

## 2019-11-19 LAB — VITAMIN D 25 HYDROXY (VIT D DEFICIENCY, FRACTURES): VITD: 37.23 ng/mL (ref 30.00–100.00)

## 2019-11-19 LAB — CBC
HCT: 39.2 % (ref 36.0–46.0)
Hemoglobin: 12.7 g/dL (ref 12.0–15.0)
MCHC: 32.3 g/dL (ref 30.0–36.0)
MCV: 91.2 fl (ref 78.0–100.0)
Platelets: 392 10*3/uL (ref 150.0–400.0)
RBC: 4.3 Mil/uL (ref 3.87–5.11)
RDW: 13.8 % (ref 11.5–15.5)
WBC: 9.3 10*3/uL (ref 4.0–10.5)

## 2019-11-19 LAB — TSH: TSH: 2.84 u[IU]/mL (ref 0.35–4.50)

## 2019-11-19 LAB — HEMOGLOBIN A1C: Hgb A1c MFr Bld: 5.5 % (ref 4.6–6.5)

## 2019-11-19 MED ORDER — LISINOPRIL 10 MG PO TABS: 15.0000 mg | ORAL_TABLET | Freq: Every day | ORAL | 1 refills | Status: DC

## 2019-11-19 NOTE — Progress Notes (Signed)
This visit occurred during the SARS-CoV-2 public health emergency.  Safety protocols were in place, including screening questions prior to the visit, additional usage of staff PPE, and extensive cleaning of exam room while observing appropriate contact time as indicated for disinfecting solutions.    Patient ID: Kayla Reeves, female  DOB: [birth year], 44 y.o.   MRN: [id number] Patient Care Team    Relationship Specialty Notifications Start End  Ma Hillock, DO PCP - General Family Medicine  11/10/18   Ma Hillock, DO PCP - Family Medicine Family Medicine  07/05/15   Megan Salon, MD Consulting Physician Gynecology  11/03/18     Chief Complaint  Patient presents with  . Annual Exam    Fasting. Mammogram 11/04/2019. Pap smear- pt has had recent Pap but unsure of date- sent request. Takes BP med at night.     Subjective:  Kayla Reeves is a 44 y.o.  Female  present for CPE. All past medical history, surgical history, allergies, family history, immunizations, medications and social history were updated in the electronic medical record today. All recent labs, ED visits and hospitalizations within the last year were reviewed.  Health maintenance:  Colonoscopy: No Fhx, screen at 82 Mammogram:Completed 10/2019 . Now est w/ gyn>> can order early if desired or she ask gyn to perform.  Cervical cancer screening: last pap 11/2018, results: normal, neg HPV, completed by: Dr. Sabra Heck Immunizations: tdapUTD 2018, Influenzaupdated today (encouraged yearly) Infectious disease screening: HIV completed DEXA: N/A Assistive device: none Oxygen VQQ:VZDG Patient has a Dental home. Hospitalizations/ED visits: reviewed  Hypertension/morbid obesity: Pt reports compliance with Lisinopril 10 mg QD without side effects.Blood pressures ranges at home are 387-564 systolic routinely. Patient denies chest pain, shortness of breath, dizziness or lower extremity edema.    Labs UTD  10/2018 Diet: does not routinely watch Exercise: yoga RF: HTN, obesity, FHX HD and stroke  Depression screen [facility] 2/9 11/19/2019 11/03/2018 04/02/2018 12/11/2017 11/03/2017  Decreased Interest 0 0 0 0 0  Down, Depressed, Hopeless 0 0 0 0 0  PHQ - 2 Score 0 0 0 0 0   No flowsheet data found.  Immunization History  Administered Date(s) Administered  . Influenza,inj,Quad PF,6+ Mos 11/19/2019  . Tdap 11/04/2006, 11/03/2017   Past Medical History:  Diagnosis Date  . Hypertension   . Seasonal allergies   . Urinary incontinence    No Known Allergies Past Surgical History:  Procedure Laterality Date  . NO PAST SURGERIES     Family History  Problem Relation Age of Onset  . Diabetes Father   . Aneurysm Father        brain  . Polycythemia Father   . Stroke Mother 73  . High blood pressure Mother   . Rheum arthritis Mother   . Hypertension Mother   . Mitral valve prolapse Mother   . Breast cancer Sister        breast  . COPD Maternal Grandmother   . Stroke Paternal Grandmother   . Heart disease Paternal Grandfather   . Breast cancer Maternal Aunt    Social History   Social History Narrative   Ms Rinck lives with husband & 3 kids.     Allergies as of 11/19/2019   No Known Allergies     Medication List       Accurate as of November 19, 2019 10:14 AM. If you have any questions, ask your nurse or doctor.        lisinopril  10 MG tablet Commonly known as: ZESTRIL Take 1.5 tablets (15 mg total) by mouth daily. What changed: how much to take Changed by: Howard Pouch, DO   MULTIVITAMIN ADULT PO Take 1 tablet by mouth daily.   Vitamin D (Cholecalciferol) 50 MCG (2000 UT) Caps Take by mouth daily.       All past medical history, surgical history, allergies, family history, immunizations andmedications were updated in the EMR today and reviewed under the history and medication portions of their EMR.     No results found for this or any previous visit (from the past  2160 hour(s)).  MM DIAG BREAST TOMO UNI LEFT  Result Date: 11/04/2019 CLINICAL DATA:  Screening recall for a possible left breast mass. EXAM: DIGITAL DIAGNOSTIC UNILATERAL LEFT MAMMOGRAM WITH CAD AND TOMO COMPARISON:  Previous exam(s). ACR Breast Density Category b: There are scattered areas of fibroglandular density. FINDINGS: Spot compression tomosynthesis imaging with a mole marker marking a skin lesion on the upper-outer left breast demonstrates that the mass seen on the screening mammogram does correspond with this skin lesion. No suspicious calcifications, masses or areas of distortion are seen in the left breast. Mammographic images were processed with CAD. IMPRESSION: The left breast mass on the screening mammogram corresponds with a skin lesion. RECOMMENDATION: Screening mammogram in one year.(Code:[id number]-[id number]) I have discussed the findings and recommendations with the patient. If applicable, a reminder letter will be sent to the patient regarding the next appointment. BI-RADS CATEGORY  2: Benign. Electronically Signed   By: Ammie Ferrier M.D.   On: 11/04/2019 15:25     ROS: 14 pt review of systems performed and negative (unless mentioned in an HPI)  Objective: BP 135/85 (BP Location: Left Arm, Patient Position: Sitting, Cuff Size: Normal)   Pulse 80   Temp 98.6 F (37 C) (Temporal)   Resp 17   Ht 5' 2.25" (1.581 m)   Wt 170 lb (77.1 kg)   LMP 10/28/2019 (Exact Date)   SpO2 100%   BMI 30.84 kg/m  Gen: Afebrile. No acute distress. Nontoxic in appearance, well-developed, well-nourished,  Pleasant obese, caucasian female.  HENT: AT. [place]. Bilateral TM visualized and normal in appearance, normal external auditory canal. MMM, no oral lesions, adequate dentition. Bilateral nares within normal limits. Throat without erythema, ulcerations or exudates. no Cough on exam, no hoarseness on exam. Eyes:Pupils Equal Round Reactive to light, Extraocular movements intact,  Conjunctiva without  redness, discharge or icterus. Neck/lymp/endocrine: Supple,no lymphadenopathy, no thyromegaly CV: RRR no murmur, no edema, +2/4 P posterior tibialis pulses.  Chest: CTAB, no wheeze, rhonchi or crackles. normal Respiratory effort. good Air movement. Abd: Soft. flat. NTND. BS present. no Masses palpated. No hepatosplenomegaly. No rebound tenderness or guarding. Skin: no rashes, purpura or petechiae. Warm and well-perfused. Skin intact. Neuro/Msk:  Normal gait. PERLA. EOMi. Alert. Oriented x3.  Cranial nerves II through XII intact. Muscle strength 5/5 upper/lower extremity. DTRs equal bilaterally. Psych: Normal affect, dress and demeanor. Normal speech. Normal thought content and judgment.   No exam data present  Assessment/plan: Kayla Reeves is a 44 y.o. female present for CPE Need for influenza vaccination - Flu Vaccine QUAD 6+ mos PF IM (Fluarix Quad PF) Essential hypertension/Obesity (BMI 30-39.9) - borderline today and reports of above goal at home. - Increase lisinopril to 15 mg QD.  - CBC - Comp Met (CMET) - Lipid panel - TSH - low sodium. Exercise encouraged.  - f/u 5.5 mos. Of BP < 998 systolic routinely with increase in  lisinopril. If routinely > 262 systolic she has been instructed to call in and we would increase regimen and see her back 2 weeks after increase. Pt reports understanding.  Vitamin D deficiency - Vitamin D (25 hydroxy) Diabetes mellitus screening - HgB A1c Colon cancer screening - Cologuard Encounter for preventive health examination Patient was encouraged to exercise greater than 150 minutes a week. Patient was encouraged to choose a diet filled with fresh fruits and vegetables, and lean meats. AVS provided to patient today for education/recommendation on gender specific health and safety maintenance. Colonoscopy: No Fhx, screen at 43. Dicussed changes in screening recs to start surveillance at 43>> she is agreeable to cologuard. She will check with  insurance prior for coverage.  Mammogram:Completed 10/2019 . Now est w/ gyn>> can order early if desired or she ask gyn to perform.  Cervical cancer screening: last pap 11/2018, results: normal, neg HPV, completed by: Dr. Sabra Heck Immunizations: tdapUTD 2018, Influenzaupdated today (encouraged yearly) Infectious disease screening: HIV completed DEXA: N/A   F/U :  5.5 months for HTN & Return in about 1 year (around 11/18/2020) for CPE (30 min), [facility] (30 min).  Orders Placed This Encounter  Procedures  . Flu Vaccine QUAD 6+ mos PF IM (Fluarix Quad PF)  . CBC  . Comp Met (CMET)  . HgB A1c  . Lipid panel  . TSH  . Vitamin D (25 hydroxy)  . Cologuard   Meds ordered this encounter  Medications  . lisinopril (ZESTRIL) 10 MG tablet    Sig: Take 1.5 tablets (15 mg total) by mouth daily.    Dispense:  135 tablet    Refill:  1   Referral Orders  No referral(s) requested today     Electronically signed by: Howard Pouch, [place]

## 2019-11-19 NOTE — Patient Instructions (Signed)
We are committed to keeping you informed about the COVID-19 vaccine.  As the vaccine continues to become available for each phase, we will ensure that patients who meet the criteria receive the information they need to access vaccination opportunities. Continue to check your MyChart account and [email or web address] for updates. Please review the Phase 1b information below.  Following [place]'s guidelines for the distribution of COVID-19 vaccines we are pleased to share our plans to begin offering vaccines to those 46 and older (Phase 1b). Here are details of those plans:  [place] On Tuesday, Jan. 19, the [facility]) and [place] will begin large-scale COVID-19 vaccinations at the [place]. The vaccinations are appointment only and for those 46 and older.  It is expected that 750 will initially be vaccinated per day at the [facility]. Capacity is expected to grow in the weeks ahead. However, the number of reservations accepted depends on the amount of vaccine available.  Online or Phone Registration Only Walk-ins will NOT be accepted.  [place] will open registration to local residents on Friday, January 15. The [facility] Department will announce additional appointments at a later date.   Health Department Registration [facility] residents only)  [place] (Any local residents)  Other Counties We are also working in partnership with county health agencies in [place], [place] and [place] counties to ensure continuing vaccination availability in alignment with state guidelines in the weeks and months ahead. Information on phase 1b COVID-19 vaccination clinics being offered by local county health agencies is provided in the website links below for your convenience:  [place]  [place]'s phase 1b vaccination guidelines, prioritizing those 46 and over as the next eligible group to receive the COVID-19 vaccine, are detailed at MobCommunity.ch.   Vaccine Safety and Effectiveness Clinical trials for the Pfizer COVID-19 vaccine involved 42,000 people and showed that the vaccine is more than 95% effective in preventing COVID-19 with no serious safety concerns. Similar results have been reported for the Moderna COVID-19 vaccine. Side effects reported in the [place] clinical trials include a sore arm at the injection site, fatigue, headache, chills and fever. While side effects from the [place] COVID-19 vaccine are higher than for a typical flu vaccine, they are lower in many ways than side effects from the leading vaccine to prevent shingles. Side effects are signs that a vaccine is working and are related to your immune system being stimulated to produce antibodies against infection. Side effects from vaccination are far less significant than health impacts from COVID-19.  Staying Informed Pharmacists, infectious disease doctors, critical care nurses and other experts at [facility] continue to speak publicly through media interviews and direct communication with our patients and communities about the safety, effectiveness and importance of vaccines to eliminate COVID-19. In addition, reliable information on vaccine safety, effectiveness, side effects and more is available on the following websites:  [place] Department of Health and Human Services COVID-19 Vaccine Information Website.  U.S. Centers for Disease Control and Prevention ZOXWR-60 Human resources officer.  Staying Safe We agree with the CDC on what we can do to help our communities get back to normal: Getting "back to normal" is going to take all of our tools. If we use all the tools we have, we stand the best chance of getting our families, communities, schools and workplaces "back to normal"  sooner:  Get vaccinated as soon as vaccines become available within the phase of the state's vaccination rollout plan for which you meet the eligibility criteria.  Wear a mask.  Stay 6 feet from others and avoid crowds.  Wash hands often.  For our most current information, please visit [email or web address]. COVID-19 Vaccine Information can be found at: ShippingScam.co.uk For questions related to vaccine distribution or appointments, please email [email or web address] or call [phone].     COVID vaccines are starting for the community.  1. Health Department: Appointments are required .Once your age bracket is called (watch news and visit website below for info) appt can be made by calling [phone] and selecting option 2.  Walk-ins will not be accepted.                  Clinic locations are: [facility], [place]; [facility] [address], [place]; . [place] at [facility], [address], [address], [place]. Visit [email or web address] and click on the "WIOMB-55 Vaccine Info" rectangle for more information about vaccinations.  2.  web page will also have up to date info on vaccinations offered through [place] at  [email or web address]-[email or web address] - Appt also required.  - vaccines are administered at the [employer] ([facility]). . If website indicates you are eligible for vaccine- please call [phone]   3. [facility]  -This will be done according to the rollout plan published by the [place] ([place]). The first public phase, also known as "Phase 1(b)", is for individuals 17 and older. Location: [facility] (Health and Arts development officer) [place] [address]; [place] [address] -  All citizens receiving a vaccine must complete a COVID-19 consent form. To lessen your wait time, these can be found and completed ahead of time at www.[email or web address] or you may pick up a paper copy at the [facility] [phone] in [place]) or at [employer]. - Future dates and locations will be widely shared through the [place] Division of Public Health's QMGNO-03 Hotline at [phone], media outlets in the [place] and through the [employer] (www.[email or web address].[email or web address] and www.[email or web address]). Citizens can also contact the [place] at [phone] for more information   Participants are asked to wear a face covering at vaccination sites.   Health Maintenance, Female Adopting a healthy lifestyle and getting preventive care are important in promoting health and wellness. Ask your health care provider about:  The right schedule for you to have regular tests and exams.  Things you can do on your own to prevent diseases and keep yourself healthy. What should I know about diet, weight, and exercise? Eat a healthy diet   Eat a diet that includes plenty of vegetables, fruits, low-fat dairy products, and lean protein.  Do not eat a lot of foods that are high in solid fats, added sugars, or sodium. Maintain a healthy weight Body mass index (BMI) is used to identify weight problems. It estimates body fat based on height and weight. Your health care provider can help determine your BMI and help you achieve or maintain a healthy weight. Get regular exercise Get regular exercise. This is one of the most important things you can do for your health. Most adults should:  Exercise for at least 150 minutes each week. The exercise should  increase your heart rate and make you sweat (moderate-intensity exercise).  Do strengthening  exercises at least twice a week. This is in addition to the moderate-intensity exercise.  Spend less time sitting. Even light physical activity can be beneficial. Watch cholesterol and blood lipids Have your blood tested for lipids and cholesterol at 46 years of age, then have this test every 5 years. Have your cholesterol levels checked more often if:  Your lipid or cholesterol levels are high.  You are older than 46 years of age.  You are at high risk for heart disease. What should I know about cancer screening? Depending on your health history and family history, you may need to have cancer screening at various ages. This may include screening for:  Breast cancer.  Cervical cancer.  Colorectal cancer.  Skin cancer.  Lung cancer. What should I know about heart disease, diabetes, and high blood pressure? Blood pressure and heart disease  High blood pressure causes heart disease and increases the risk of stroke. This is more likely to develop in people who have high blood pressure readings, are of African descent, or are overweight.  Have your blood pressure checked: ? Every 3-5 years if you are 10-17 years of age. ? Every year if you are 43 years old or older. Diabetes Have regular diabetes screenings. This checks your fasting blood sugar level. Have the screening done:  Once every three years after age 69 if you are at a normal weight and have a low risk for diabetes.  More often and at a younger age if you are overweight or have a high risk for diabetes. What should I know about preventing infection? Hepatitis B If you have a higher risk for hepatitis B, you should be screened for this virus. Talk with your health care provider to find out if you are at risk for hepatitis B infection. Hepatitis C Testing is recommended for:  Everyone born from [birth year] through 1967.  Anyone with known risk factors for hepatitis C. Sexually transmitted infections (STIs)  Get screened  for STIs, including gonorrhea and chlamydia, if: ? You are sexually active and are younger than 46 years of age. ? You are older than 46 years of age and your health care provider tells you that you are at risk for this type of infection. ? Your sexual activity has changed since you were last screened, and you are at increased risk for chlamydia or gonorrhea. Ask your health care provider if you are at risk.  Ask your health care provider about whether you are at high risk for HIV. Your health care provider may recommend a prescription medicine to help prevent HIV infection. If you choose to take medicine to prevent HIV, you should first get tested for HIV. You should then be tested every 3 months for as long as you are taking the medicine. Pregnancy  If you are about to stop having your period (premenopausal) and you may become pregnant, seek counseling before you get pregnant.  Take 400 to 800 micrograms (mcg) of folic acid every day if you become pregnant.  Ask for birth control (contraception) if you want to prevent pregnancy. Osteoporosis and menopause Osteoporosis is a disease in which the bones lose minerals and strength with aging. This can result in bone fractures. If you are 8 years old or older, or if you are at risk for osteoporosis and fractures, ask your health care provider if you should:  Be screened for bone loss.  Take  a calcium or vitamin D supplement to lower your risk of fractures.  Be given hormone replacement therapy (HRT) to treat symptoms of menopause. Follow these instructions at home: Lifestyle  Do not use any products that contain nicotine or tobacco, such as cigarettes, e-cigarettes, and chewing tobacco. If you need help quitting, ask your health care provider.  Do not use street drugs.  Do not share needles.  Ask your health care provider for help if you need support or information about quitting drugs. Alcohol use  Do not drink alcohol if: ? Your  health care provider tells you not to drink. ? You are pregnant, may be pregnant, or are planning to become pregnant.  If you drink alcohol: ? Limit how much you use to 0-1 drink a day. ? Limit intake if you are breastfeeding.  Be aware of how much alcohol is in your drink. In the U.S., one drink equals one 12 oz bottle of beer (355 mL), one 5 oz glass of wine (148 mL), or one 1 oz glass of hard liquor (44 mL). General instructions  Schedule regular health, dental, and eye exams.  Stay current with your vaccines.  Tell your health care provider if: ? You often feel depressed. ? You have ever been abused or do not feel safe at home. Summary  Adopting a healthy lifestyle and getting preventive care are important in promoting health and wellness.  Follow your health care provider's instructions about healthy diet, exercising, and getting tested or screened for diseases.  Follow your health care provider's instructions on monitoring your cholesterol and blood pressure. This information is not intended to replace advice given to you by your health care provider. Make sure you discuss any questions you have with your health care provider. Document Revised: 10/14/2018 Document Reviewed: 10/14/2018 Elsevier Patient Education  2020 [employer].

## 2019-12-12 LAB — COLOGUARD
COLOGUARD: NEGATIVE
Cologuard: NEGATIVE

## 2020-01-29 ENCOUNTER — Ambulatory Visit: Payer: 59 | Attending: Internal Medicine

## 2020-01-29 DIAGNOSIS — Z23 Encounter for immunization: Secondary | ICD-10-CM

## 2020-01-29 NOTE — Progress Notes (Signed)
   Covid-19 Vaccination Clinic  Name:  Kayla Reeves    MRN: [id number] DOB: [birth year]  01/29/2020  Kayla Reeves was observed post Covid-19 immunization for 15 minutes without incident. She was provided with Vaccine Information Sheet and instruction to access the V-Safe system.   Kayla Reeves was instructed to call 911 with any severe reactions post vaccine: [facility] Difficulty breathing  . Swelling of face and throat  . A fast heartbeat  . A bad rash all over body  . Dizziness and weakness   Immunizations Administered    Name Date Dose VIS Date Route   Pfizer COVID-19 Vaccine 01/29/2020  3:54 PM 0.3 mL 10/15/2019 Intramuscular   Manufacturer: [place]   Lot: [id number]   [place]: [id number]

## 2020-02-23 ENCOUNTER — Ambulatory Visit: Payer: 59

## 2020-02-29 ENCOUNTER — Ambulatory Visit: Payer: 59 | Attending: Internal Medicine

## 2020-02-29 DIAGNOSIS — Z23 Encounter for immunization: Secondary | ICD-10-CM

## 2020-02-29 NOTE — Progress Notes (Signed)
   Covid-19 Vaccination Clinic  Name:  Kayla Reeves    MRN: [id number] DOB: 1976/08/06  02/29/2020  [name] was observed post Covid-19 immunization for 15 minutes without incident. She was provided with Vaccine Information Sheet and instruction to access the V-Safe system.   [name] was instructed to call 911 with any severe reactions post vaccine: [place] Kitchen Difficulty breathing  . Swelling of face and throat  . A fast heartbeat  . A bad rash all over body  . Dizziness and weakness   Immunizations Administered    Name Date Dose VIS Date Route   Pfizer COVID-19 Vaccine 02/29/2020 12:07 PM 0.3 mL 12/29/2018 Intramuscular   Manufacturer: [place]   Lot: [id number]   [place]: [id number]

## 2020-04-29 ENCOUNTER — Other Ambulatory Visit: Payer: Self-pay | Admitting: Family Medicine

## 2020-04-29 DIAGNOSIS — I10 Essential (primary) hypertension: Secondary | ICD-10-CM

## 2020-06-16 ENCOUNTER — Other Ambulatory Visit: Payer: Self-pay

## 2020-06-16 ENCOUNTER — Telehealth: Payer: Self-pay | Admitting: Family Medicine

## 2020-06-16 DIAGNOSIS — I10 Essential (primary) hypertension: Secondary | ICD-10-CM

## 2020-06-16 MED ORDER — LISINOPRIL 10 MG PO TABS: 15.0000 mg | ORAL_TABLET | Freq: Every day | ORAL | 1 refills | Status: DC

## 2020-06-16 NOTE — Telephone Encounter (Signed)
  LAST APPOINTMENT DATE: 11/19/2019   NEXT APPOINTMENT DATE:@8 /30/21  MEDICATION: lisinopril (ZESTRIL) 10 MG tablet  PHARMACY: [id number] [place], [place] - [address]  COMMENTS: Patient is completely out.

## 2020-06-16 NOTE — Telephone Encounter (Signed)
Rx sent 

## 2020-07-03 ENCOUNTER — Encounter: Payer: Self-pay | Admitting: Family Medicine

## 2020-07-03 ENCOUNTER — Ambulatory Visit (INDEPENDENT_AMBULATORY_CARE_PROVIDER_SITE_OTHER): Payer: 59 | Admitting: Family Medicine

## 2020-07-03 ENCOUNTER — Other Ambulatory Visit: Payer: Self-pay

## 2020-07-03 VITALS — BP 136/84 | HR 72 | Temp 98.2°F | Resp 16 | Ht 62.25 in | Wt 170.2 lb

## 2020-07-03 DIAGNOSIS — I10 Essential (primary) hypertension: Secondary | ICD-10-CM | POA: Diagnosis not present

## 2020-07-03 DIAGNOSIS — Z8261 Family history of arthritis: Secondary | ICD-10-CM | POA: Diagnosis not present

## 2020-07-03 DIAGNOSIS — M25542 Pain in joints of left hand: Secondary | ICD-10-CM

## 2020-07-03 MED ORDER — LISINOPRIL 20 MG PO TABS: 20.0000 mg | ORAL_TABLET | Freq: Every day | ORAL | 1 refills | Status: DC

## 2020-07-03 NOTE — Progress Notes (Signed)
Pre visit review using our clinic review tool, if applicable. No additional management support is needed unless otherwise documented below in the visit note. 

## 2020-07-03 NOTE — Patient Instructions (Signed)
lisinopril dose now 20 g  A day. New pill will be 20 mg per tab (take one only).   I will call you with lab results  Tylenol 1 extra strength tab nightly can help arthritis.  Tumeric  supplement also ok.   Next appt end of February for Physical.    Arthritis Arthritis means joint pain. It can also mean joint disease. A joint is a place where bones come together. There are more than 100 types of arthritis. What are the causes? This condition may be caused by:  Wear and tear of a joint. This is the most common cause.  A lot of acid in the blood, which leads to pain in the joint (gout).  Pain and swelling (inflammation) in a joint.  Infection of a joint.  Injuries in the joint.  A reaction to medicines (allergy). In some cases, the cause may not be known. What are the signs or symptoms? Symptoms of this condition include:  Redness at a joint.  Swelling at a joint.  Stiffness at a joint.  Warmth coming from the joint.  A fever.  A feeling of being sick. How is this treated? This condition may be treated with:  Treating the cause, if it is known.  Rest.  Raising (elevating) the joint.  Putting cold or hot packs on the joint.  Medicines to treat symptoms and reduce pain and swelling.  Shots of medicines (cortisone) into the joint. You may also be told to make changes in your life, such as doing exercises and losing weight. Follow these instructions at home: Medicines  Take over-the-counter and prescription medicines only as told by your doctor.  Do not take aspirin for pain if your doctor says that you may have gout. Activity  Rest your joint if your doctor tells you to.  Avoid activities that make the pain worse.  Exercise your joint regularly as told by your doctor. Try doing exercises like: ? Swimming. ? Water aerobics. ? Biking. ? Walking. Managing pain, stiffness, and swelling      If told, put ice on the affected area. ? Put ice in a  plastic bag. ? Place a towel between your skin and the bag. ? Leave the ice on for 20 minutes, 2-3 times per day.  If your joint is swollen, raise (elevate) it above the level of your heart if told by your doctor.  If your joint feels stiff in the morning, try taking a warm shower.  If told, put heat on the affected area. Do this as often as told by your doctor. Use the heat source that your doctor recommends, such as a moist heat pack or a heating pad. If you have diabetes, do not apply heat without asking your doctor. To apply heat: ? Place a towel between your skin and the heat source. ? Leave the heat on for 20-30 minutes. ? Remove the heat if your skin turns bright red. This is very important if you are unable to feel pain, heat, or cold. You may have a greater risk of getting burned. General instructions  Do not use any products that contain nicotine or tobacco, such as cigarettes, e-cigarettes, and chewing tobacco. If you need help quitting, ask your doctor.  Keep all follow-up visits as told by your doctor. This is important. Contact a doctor if:  The pain gets worse.  You have a fever. Get help right away if:  You have very bad pain in your joint.  You have  swelling in your joint.  Your joint is red.  Many joints become painful and swollen.  You have very bad back pain.  Your leg is very weak.  You cannot control your pee (urine) or poop (stool). Summary  Arthritis means joint pain. It can also mean joint disease. A joint is a place where bones come together.  The most common cause of this condition is wear and tear of a joint.  Symptoms of this condition include redness, swelling, or stiffness of the joint.  This condition is treated with rest, raising the joint, medicines, and putting cold or hot packs on the joint.  Follow your doctor's instructions about medicines, activity, exercises, and other home care treatments. This information is not intended to  replace advice given to you by your health care provider. Make sure you discuss any questions you have with your health care provider. Document Revised: 09/28/2018 Document Reviewed: 09/28/2018 Elsevier Patient Education  2020 [employer].

## 2020-07-03 NOTE — Progress Notes (Signed)
This visit occurred during the SARS-CoV-2 public health emergency.  Safety protocols were in place, including screening questions prior to the visit, additional usage of staff PPE, and extensive cleaning of exam room while observing appropriate contact time as indicated for disinfecting solutions.    Patient ID: Jakalyn Kratky, female  DOB: 11-13-1975, 44 y.o.   MRN: [id number] Patient Care Team    Relationship Specialty Notifications Start End  Ma Hillock, DO PCP - General Family Medicine  11/10/18   Ma Hillock, DO PCP - Family Medicine Family Medicine  07/05/15   Megan Salon, MD Consulting Physician Gynecology  11/03/18     Chief Complaint  Patient presents with  . Follow-up    BP    Subjective:  Sahaana Loan Oguin is a 44 y.o.  Female  present for Hypertension/morbid obesity: Pt reports compliance  with Lisinopril 15 mg QD without side effects.Blood pressures ranges at home are > 948 systolic routinely. Patient denies chest pain, shortness of breath, dizziness or lower extremity edema.  Diet: does not routinely watch Exercise: yoga RF: HTN, obesity, FHX HD and stroke  Hand arthralgia: Mother with history of RA.No redness or swelling. Morning stiffness both hands, Left > right. She is right handed. Hands feel better as day goes on. 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 No finger deformities. No other joint pain. She would like to know if she has rheumatoid arthritis.     Depression screen [facility] 2/9 11/19/2019 11/03/2018 04/02/2018 12/11/2017 11/03/2017  Decreased Interest 0 0 0 0 0  Down, Depressed, Hopeless 0 0 0 0 0  PHQ - 2 Score 0 0 0 0 0   No flowsheet data found.  Immunization History  Administered Date(s) Administered  . Influenza,inj,Quad PF,6+ Mos 11/19/2019  . PFIZER SARS-COV-2 Vaccination 01/29/2020, 02/29/2020  . Tdap 11/04/2006, 11/03/2017   Past Medical History:  Diagnosis Date  . Hypertension   . Seasonal allergies   . Urinary  incontinence    No Known Allergies Past Surgical History:  Procedure Laterality Date  . NO PAST SURGERIES     Family History  Problem Relation Age of Onset  . Diabetes Father   . Aneurysm Father        brain  . Polycythemia Father   . Stroke Mother 50  . High blood pressure Mother   . Rheum arthritis Mother   . Hypertension Mother   . Mitral valve prolapse Mother   . Breast cancer Sister        breast  . COPD Maternal Grandmother   . Stroke Paternal Grandmother   . Heart disease Paternal Grandfather   . Breast cancer Maternal Aunt    Social History   Social History Narrative   Ms Wenz lives with husband & 3 kids.     Allergies as of 07/03/2020   No Known Allergies     Medication List       Accurate as of July 03, 2020 11:08 AM. If you have any questions, ask your nurse or doctor.        lisinopril 20 MG tablet Commonly known as: ZESTRIL Take 1 tablet (20 mg total) by mouth daily. What changed:   medication strength  how much to take Changed by: Howard Pouch, DO   MULTIVITAMIN ADULT PO Take 1 tablet by mouth daily.   Vitamin D (Cholecalciferol) 50 MCG (2000 UT) Caps Take by mouth daily.       All past medical history,  surgical history, allergies, family history, immunizations andmedications were updated in the EMR today and reviewed under the history and medication portions of their EMR.     No results found for this or any previous visit (from the past 2160 hour(s)).   ROS: 14 pt review of systems performed and negative (unless mentioned in an HPI)  Objective: BP 136/84 (BP Location: Left Arm)   Pulse 72   Temp 98.2 F (36.8 C) (Oral)   Resp 16   Ht 5' 2.25" (1.581 m)   Wt 170 lb 4 oz (77.2 kg)   LMP 06/18/2020 (Approximate)   SpO2 100%   BMI 30.89 kg/m  Gen: Afebrile. No acute distress. Nontoxic pleasant female. Obese.  HENT: AT. [place].  Neck/lymp/endocrine: Supple,no lymphadenopathy, no thyromegaly CV: RRR no murmur, no edema, +2/4  P posterior tibialis pulses Chest: CTAB, no wheeze or crackles MSK: no erythema, no swelling of hands. Full ROM. Bilateral index with mild nodular bony changes DIP joints- otherwise no hand deformities. NV intact.   Skin: no rashes, purpura or petechiae.  Neuro:  Normal gait. PERLA. EOMi. Alert. Oriented x3 Psych: Normal affect, dress and demeanor. Normal speech. Normal thought content and judgment.   No exam data present  Assessment/plan: Jeany Keeleigh Terris is a 44 y.o. female present for  Essential hypertension/Obesity (BMI 30-39.9) - mildly above goal. - Increase lisinopril to 20 mg QD.  - low sodium. Exercise encouraged.  - f/u 5.5 mos. For cpe  Arthralgia of hand, left/FH: rheumatoid arthritis Pt currently without severe pain and sx seem more consistent with OA, however can not rule RA given FH in her mother. She would like to pursue testing today.  - tylenol QD can be helpful and tumeric.  - ANA,IFA RA Diag Pnl w/rflx Tit/Patn - pt will be called with results.    Return in about 6 months (around 12/27/2020) for CPE (30 min).  Orders Placed This Encounter  Procedures  . ANA,IFA RA Diag Pnl w/rflx Tit/Patn   Meds ordered this encounter  Medications  . lisinopril (ZESTRIL) 20 MG tablet    Sig: Take 1 tablet (20 mg total) by mouth daily.    Dispense:  90 tablet    Refill:  1   Referral Orders  No referral(s) requested today     Electronically signed by: Howard Pouch, [place]

## 2020-07-06 LAB — ANTI-NUCLEAR AB-TITER (ANA TITER)
ANA TITER: 1:80 {titer} — ABNORMAL HIGH
ANA Titer 1: 1:40 {titer} — ABNORMAL HIGH

## 2020-07-06 LAB — ANA,IFA RA DIAG PNL W/RFLX TIT/PATN
Anti Nuclear Antibody (ANA): POSITIVE — AB
Cyclic Citrullin Peptide Ab: <16 UNITS
Rheumatoid fact SerPl-aCnc: <14 IU/mL (ref <14)

## 2020-07-06 NOTE — Progress Notes (Signed)
Left detailed VM per DPR

## 2020-12-01 ENCOUNTER — Encounter: Payer: Self-pay | Admitting: Family Medicine

## 2020-12-01 ENCOUNTER — Ambulatory Visit (INDEPENDENT_AMBULATORY_CARE_PROVIDER_SITE_OTHER): Payer: 59 | Admitting: Family Medicine

## 2020-12-01 ENCOUNTER — Other Ambulatory Visit: Payer: Self-pay

## 2020-12-01 VITALS — BP 121/85 | HR 81 | Temp 98.0°F | Ht 62.0 in | Wt 170.0 lb

## 2020-12-01 DIAGNOSIS — E669 Obesity, unspecified: Secondary | ICD-10-CM

## 2020-12-01 DIAGNOSIS — Z1231 Encounter for screening mammogram for malignant neoplasm of breast: Secondary | ICD-10-CM

## 2020-12-01 DIAGNOSIS — Z0000 Encounter for general adult medical examination without abnormal findings: Secondary | ICD-10-CM

## 2020-12-01 DIAGNOSIS — Z23 Encounter for immunization: Secondary | ICD-10-CM | POA: Diagnosis not present

## 2020-12-01 DIAGNOSIS — Z131 Encounter for screening for diabetes mellitus: Secondary | ICD-10-CM | POA: Diagnosis not present

## 2020-12-01 DIAGNOSIS — E559 Vitamin D deficiency, unspecified: Secondary | ICD-10-CM

## 2020-12-01 DIAGNOSIS — I10 Essential (primary) hypertension: Secondary | ICD-10-CM | POA: Diagnosis not present

## 2020-12-01 LAB — COMPREHENSIVE METABOLIC PANEL
ALT: 15 U/L (ref 0–35)
AST: 18 U/L (ref 0–37)
Albumin: 4.5 g/dL (ref 3.5–5.2)
Alkaline Phosphatase: 51 U/L (ref 39–117)
BUN: 16 mg/dL (ref 6–23)
CO2: 27 mEq/L (ref 19–32)
Calcium: 9.6 mg/dL (ref 8.4–10.5)
Chloride: 103 mEq/L (ref 96–112)
Creatinine, Ser: 0.72 mg/dL (ref 0.40–1.20)
GFR: 99.93 mL/min (ref >60.00)
Glucose, Bld: 91 mg/dL (ref 70–99)
Potassium: 4.3 mEq/L (ref 3.5–5.1)
Sodium: 137 mEq/L (ref 135–145)
Total Bilirubin: 0.5 mg/dL (ref 0.2–1.2)
Total Protein: 8.1 g/dL (ref 6.0–8.3)

## 2020-12-01 LAB — CBC WITH DIFFERENTIAL/PLATELET
Basophils Absolute: 0.1 10*3/uL (ref 0.0–0.1)
Basophils Relative: 1.2 % (ref 0.0–3.0)
Eosinophils Absolute: 0.3 10*3/uL (ref 0.0–0.7)
Eosinophils Relative: 3 % (ref 0.0–5.0)
HCT: 40.2 % (ref 36.0–46.0)
Hemoglobin: 13 g/dL (ref 12.0–15.0)
Lymphocytes Relative: 23.6 % (ref 12.0–46.0)
Lymphs Abs: 2.1 10*3/uL (ref 0.7–4.0)
MCHC: 32.4 g/dL (ref 30.0–36.0)
MCV: 90.5 fl (ref 78.0–100.0)
Monocytes Absolute: 0.6 10*3/uL (ref 0.1–1.0)
Monocytes Relative: 6.1 % (ref 3.0–12.0)
Neutro Abs: 5.9 10*3/uL (ref 1.4–7.7)
Neutrophils Relative %: 66.1 % (ref 43.0–77.0)
Platelets: 344 10*3/uL (ref 150.0–400.0)
RBC: 4.43 Mil/uL (ref 3.87–5.11)
RDW: 14 % (ref 11.5–15.5)
WBC: 9 10*3/uL (ref 4.0–10.5)

## 2020-12-01 LAB — VITAMIN D 25 HYDROXY (VIT D DEFICIENCY, FRACTURES): VITD: 53.64 ng/mL (ref 30.00–100.00)

## 2020-12-01 LAB — LIPID PANEL
Cholesterol: 185 mg/dL (ref 0–200)
HDL: 59.6 mg/dL (ref >39.00)
LDL Cholesterol: 108 mg/dL — ABNORMAL HIGH (ref 0–99)
NonHDL: 125.56
Total CHOL/HDL Ratio: 3
Triglycerides: 86 mg/dL (ref 0.0–149.0)
VLDL: 17.2 mg/dL (ref 0.0–40.0)

## 2020-12-01 LAB — TSH: TSH: 2.53 u[IU]/mL (ref 0.35–4.50)

## 2020-12-01 LAB — HEMOGLOBIN A1C: Hgb A1c MFr Bld: 5.4 % (ref 4.6–6.5)

## 2020-12-01 MED ORDER — LISINOPRIL 20 MG PO TABS: 20.0000 mg | ORAL_TABLET | Freq: Every day | ORAL | 1 refills | Status: DC

## 2020-12-01 NOTE — Addendum Note (Signed)
Addended by: Kavin Leech on: 12/01/2020 08:46 AM   Modules accepted: Orders

## 2020-12-01 NOTE — Progress Notes (Signed)
This visit occurred during the SARS-CoV-2 public health emergency.  Safety protocols were in place, including screening questions prior to the visit, additional usage of staff PPE, and extensive cleaning of exam room while observing appropriate contact time as indicated for disinfecting solutions.    Patient ID: Kayla Reeves, female  DOB: September 20, 1976, 45 y.o.   MRN: [id number] Patient Care Team    Relationship Specialty Notifications Start End  Ma Hillock, DO PCP - General Family Medicine  11/10/18   Ma Hillock, DO PCP - Family Medicine Family Medicine  07/05/15   Megan Salon, MD Consulting Physician Gynecology  11/03/18     Chief Complaint  Patient presents with  . Annual Exam    Pt is fasting    Subjective:  Kayla Reeves is a 45 y.o.  Female  present for CPE [facility]. All past medical history, surgical history, allergies, family history, immunizations, medications and social history were updated in the electronic medical record today. All recent labs, ED visits and hospitalizations within the last year were reviewed.  Health maintenance:  Colonoscopy: No Fhx, Cologuard completed 12/2019- negative- rpt 3 yr Mammogram:Completed 10/2019. Ordered today.  Cervical cancer screening: last pap 11/2018, results: normal, neg HPV, completed by: Dr. Sabra Heck Immunizations: tdapUTD 2018, Influenza due- adminstred (encouraged yearly), covid completed- booster (she will schedule). Infectious disease screening: HIV completed DEXA: N/A Assistive device: none Oxygen PPI:RJJO Patient has a Dental home. Hospitalizations/ED visits: reviewed  Hypertension/morbid obesity: Pt reportscompliance with Lisinopril 20 mg QD without side effects.. Patient denies chest pain, shortness of breath, dizziness or lower extremity edema.  Diet: does not routinely watch Exercise: yoga RF: HTN, obesity, FHX HD and stroke  Depression screen [facility] 2/9 12/01/2020 11/19/2019 11/03/2018 04/02/2018  12/11/2017  Decreased Interest 0 0 0 0 0  Down, Depressed, Hopeless 0 0 0 0 0  PHQ - 2 Score 0 0 0 0 0   No flowsheet data found.   Immunization History  Administered Date(s) Administered  . Influenza,inj,Quad PF,6+ Mos 11/19/2019  . PFIZER(Purple Top)SARS-COV-2 Vaccination 01/29/2020, 02/29/2020  . Tdap 11/04/2006, 11/03/2017   Past Medical History:  Diagnosis Date  . Hypertension   . Seasonal allergies   . Urinary incontinence    No Known Allergies Past Surgical History:  Procedure Laterality Date  . NO PAST SURGERIES     Family History  Problem Relation Age of Onset  . Diabetes Father   . Aneurysm Father        brain  . Polycythemia Father   . Stroke Mother 35  . High blood pressure Mother   . Rheum arthritis Mother   . Hypertension Mother   . Mitral valve prolapse Mother   . Breast cancer Sister        breast  . COPD Maternal Grandmother   . Stroke Paternal Grandmother   . Heart disease Paternal Grandfather   . Breast cancer Maternal Aunt    Social History   Social History Narrative   Ms Grondahl lives with husband & 3 kids.     Allergies as of 12/01/2020   No Known Allergies     Medication List       Accurate as of December 01, 2020  8:37 AM. If you have any questions, ask your nurse or doctor.        lisinopril 20 MG tablet Commonly known as: ZESTRIL Take 1 tablet (20 mg total) by mouth daily.   MULTIVITAMIN ADULT PO Take 1 tablet by mouth  daily.   vitamin D3 50 MCG (2000 UT) Caps Take by mouth daily.       All past medical history, surgical history, allergies, family history, immunizations andmedications were updated in the EMR today and reviewed under the history and medication portions of their EMR.     No results found for this or any previous visit (from the past 2160 hour(s)).  ROS: 14 pt review of systems performed and negative (unless mentioned in an HPI)  Objective: BP 121/85   Pulse 81   Temp 98 F (36.7 C) (Oral)   Ht 5'  2" (1.575 m)   Wt 170 lb (77.1 kg)   SpO2 99%   BMI 31.09 kg/m  Gen: Afebrile. No acute distress. Nontoxic in appearance, well-developed, well-nourished,  Very pleasant obese female.  HENT: AT. [place]. Bilateral TM visualized and normal in appearance, normal external auditory canal. MMM, no oral lesions, adequate dentition. Bilateral nares within normal limits. Throat without erythema, ulcerations or exudates. no Cough on exam, no hoarseness on exam. Eyes:Pupils Equal Round Reactive to light, Extraocular movements intact,  Conjunctiva without redness, discharge or icterus. Neck/lymp/endocrine: Supple,no lymphadenopathy, no thyromegaly CV: RRR no murmur, noedema, +2/4 P posterior tibialis pulses.  Chest: CTAB, no wheeze, rhonchi or crackles. normal Respiratory effort. good Air movement. Abd: Soft. flat. NTND. BS present. no Masses palpated. No hepatosplenomegaly. No rebound tenderness or guarding. Skin: no rashes, purpura or petechiae. Warm and well-perfused. Skin intact. Neuro/Msk:  Normal gait. PERLA. EOMi. Alert. Oriented x3.  Cranial nerves II through XII intact. Muscle strength 5/5 upper/lower extremity. DTRs equal bilaterally. Psych: Normal affect, dress and demeanor. Normal speech. Normal thought content and judgment.   No exam data present  Assessment/plan: Zeffie Janet Reeves is a 45 y.o. female present for CPE/[facility] Essential hypertension/Obesity (BMI 30-39.9) - stable.  - continue  lisinopril to 20 mg QD.  - low sodium. Exercise encouraged.  - CBC with Differential/Platelet - Comprehensive metabolic panel - Lipid panel - TSH - lisinopril (ZESTRIL) 20 MG tablet; Take 1 tablet (20 mg total) by mouth daily.  Dispense: 90 tablet; Refill: 1 - f/u 5.5 mos. [place]  Vitamin D deficiency - VITAMIN D 25 Hydroxy (Vit-D Deficiency, Fractures) - supplementing with 2000u daily.   Diabetes mellitus screening - Hemoglobin A1c  Encounter for screening mammogram for malignant neoplasm of  breast - MM Digital Screening; Future  Influenza vaccine administered  Routine general medical examination at a health care facility Patient was encouraged to exercise greater than 150 minutes a week. Patient was encouraged to choose a diet filled with fresh fruits and vegetables, and lean meats. AVS provided to patient today for education/recommendation on gender specific health and safety maintenance. Colonoscopy: No Fhx, Cologuard completed 12/2019- negative- rpt 3 yr Mammogram:Completed 10/2019. Ordered today.  Cervical cancer screening: last pap 11/2018, results: normal, neg HPV, completed by: Dr. Sabra Heck Immunizations: tdapUTD 2018, Influenza due- adminstred (encouraged yearly), covid completed- booster (she will schedule). Infectious disease screening: HIV completed DEXA: N/A  Return in about 6 months (around 05/31/2021) for CMC (30 min) and 1 yr cpe.  Orders Placed This Encounter  Procedures  . MM Digital Screening  . CBC with Differential/Platelet  . Comprehensive metabolic panel  . Hemoglobin A1c  . Lipid panel  . TSH  . VITAMIN D 25 Hydroxy (Vit-D Deficiency, Fractures)   Meds ordered this encounter  Medications  . lisinopril (ZESTRIL) 20 MG tablet    Sig: Take 1 tablet (20 mg total) by mouth daily.  Dispense:  90 tablet    Refill:  1   Referral Orders  No referral(s) requested today     Electronically signed by: Howard Pouch, [place]

## 2020-12-01 NOTE — Patient Instructions (Signed)
Health Maintenance, Female Adopting a healthy lifestyle and getting preventive care are important in promoting health and wellness. Ask your health care provider about:  The right schedule for you to have regular tests and exams.  Things you can do on your own to prevent diseases and keep yourself healthy. What should I know about diet, weight, and exercise? Eat a healthy diet  Eat a diet that includes plenty of vegetables, fruits, low-fat dairy products, and lean protein.  Do not eat a lot of foods that are high in solid fats, added sugars, or sodium.   Maintain a healthy weight Body mass index (BMI) is used to identify weight problems. It estimates body fat based on height and weight. Your health care provider can help determine your BMI and help you achieve or maintain a healthy weight. Get regular exercise Get regular exercise. This is one of the most important things you can do for your health. Most adults should:  Exercise for at least 150 minutes each week. The exercise should increase your heart rate and make you sweat (moderate-intensity exercise).  Do strengthening exercises at least twice a week. This is in addition to the moderate-intensity exercise.  Spend less time sitting. Even light physical activity can be beneficial. Watch cholesterol and blood lipids Have your blood tested for lipids and cholesterol at 47 years of age, then have this test every 5 years. Have your cholesterol levels checked more often if:  Your lipid or cholesterol levels are high.  You are older than 47 years of age.  You are at high risk for heart disease. What should I know about cancer screening? Depending on your health history and family history, you may need to have cancer screening at various ages. This may include screening for:  Breast cancer.  Cervical cancer.  Colorectal cancer.  Skin cancer.  Lung cancer. What should I know about heart disease, diabetes, and high blood  pressure? Blood pressure and heart disease  High blood pressure causes heart disease and increases the risk of stroke. This is more likely to develop in people who have high blood pressure readings, are of African descent, or are overweight.  Have your blood pressure checked: ? Every 3-5 years if you are 18-39 years of age. ? Every year if you are 40 years old or older. Diabetes Have regular diabetes screenings. This checks your fasting blood sugar level. Have the screening done:  Once every three years after age 40 if you are at a normal weight and have a low risk for diabetes.  More often and at a younger age if you are overweight or have a high risk for diabetes. What should I know about preventing infection? Hepatitis B If you have a higher risk for hepatitis B, you should be screened for this virus. Talk with your health care provider to find out if you are at risk for hepatitis B infection. Hepatitis C Testing is recommended for:  Everyone born from 1945 through 1965.  Anyone with known risk factors for hepatitis C. Sexually transmitted infections (STIs)  Get screened for STIs, including gonorrhea and chlamydia, if: ? You are sexually active and are younger than 47 years of age. ? You are older than 47 years of age and your health care provider tells you that you are at risk for this type of infection. ? Your sexual activity has changed since you were last screened, and you are at increased risk for chlamydia or gonorrhea. Ask your health care provider   if you are at risk.  Ask your health care provider about whether you are at high risk for HIV. Your health care provider may recommend a prescription medicine to help prevent HIV infection. If you choose to take medicine to prevent HIV, you should first get tested for HIV. You should then be tested every 3 months for as long as you are taking the medicine. Pregnancy  If you are about to stop having your period (premenopausal) and  you may become pregnant, seek counseling before you get pregnant.  Take 400 to 800 micrograms (mcg) of folic acid every day if you become pregnant.  Ask for birth control (contraception) if you want to prevent pregnancy. Osteoporosis and menopause Osteoporosis is a disease in which the bones lose minerals and strength with aging. This can result in bone fractures. If you are 65 years old or older, or if you are at risk for osteoporosis and fractures, ask your health care provider if you should:  Be screened for bone loss.  Take a calcium or vitamin D supplement to lower your risk of fractures.  Be given hormone replacement therapy (HRT) to treat symptoms of menopause. Follow these instructions at home: Lifestyle  Do not use any products that contain nicotine or tobacco, such as cigarettes, e-cigarettes, and chewing tobacco. If you need help quitting, ask your health care provider.  Do not use street drugs.  Do not share needles.  Ask your health care provider for help if you need support or information about quitting drugs. Alcohol use  Do not drink alcohol if: ? Your health care provider tells you not to drink. ? You are pregnant, may be pregnant, or are planning to become pregnant.  If you drink alcohol: ? Limit how much you use to 0-1 drink a day. ? Limit intake if you are breastfeeding.  Be aware of how much alcohol is in your drink. In the [place] one drink equals one 12 oz bottle of beer (355 mL), one 5 oz glass of wine (148 mL), or one 1 oz glass of hard liquor (44 mL). General instructions  Schedule regular health, dental, and eye exams.  Stay current with your vaccines.  Tell your health care provider if: ? You often feel depressed. ? You have ever been abused or do not feel safe at home. Summary  Adopting a healthy lifestyle and getting preventive care are important in promoting health and wellness.  Follow your health care provider's instructions about healthy  diet, exercising, and getting tested or screened for diseases.  Follow your health care provider's instructions on monitoring your cholesterol and blood pressure. This information is not intended to replace advice given to you by your health care provider. Make sure you discuss any questions you have with your health care provider. Document Revised: 10/14/2018 Document Reviewed: 10/14/2018 Elsevier Patient Education  2021 [employer]  

## 2021-02-05 ENCOUNTER — Other Ambulatory Visit: Payer: Self-pay

## 2021-02-05 ENCOUNTER — Ambulatory Visit
Admission: RE | Admit: 2021-02-05 | Discharge: 2021-02-05 | Disposition: A | Discharge status: Home / Self Care | Payer: 59 | Source: Ambulatory Visit | Attending: Family Medicine | Admitting: Family Medicine

## 2021-02-05 DIAGNOSIS — Z1231 Encounter for screening mammogram for malignant neoplasm of breast: Secondary | ICD-10-CM

## 2021-05-31 ENCOUNTER — Ambulatory Visit: Payer: 59 | Admitting: Family Medicine

## 2021-05-31 DIAGNOSIS — I10 Essential (primary) hypertension: Secondary | ICD-10-CM

## 2021-06-05 ENCOUNTER — Other Ambulatory Visit: Payer: Self-pay | Admitting: Family Medicine

## 2021-06-05 DIAGNOSIS — I10 Essential (primary) hypertension: Secondary | ICD-10-CM

## 2021-06-27 ENCOUNTER — Other Ambulatory Visit: Payer: Self-pay

## 2021-06-27 ENCOUNTER — Ambulatory Visit: Payer: 59 | Admitting: Family Medicine

## 2021-06-27 ENCOUNTER — Encounter: Payer: Self-pay | Admitting: Family Medicine

## 2021-06-27 VITALS — BP 135/79 | HR 70 | Temp 97.9°F | Ht 62.0 in | Wt 171.0 lb

## 2021-06-27 DIAGNOSIS — I10 Essential (primary) hypertension: Secondary | ICD-10-CM

## 2021-06-27 DIAGNOSIS — E669 Obesity, unspecified: Secondary | ICD-10-CM | POA: Diagnosis not present

## 2021-06-27 DIAGNOSIS — E559 Vitamin D deficiency, unspecified: Secondary | ICD-10-CM | POA: Diagnosis not present

## 2021-06-27 MED ORDER — LISINOPRIL 20 MG PO TABS
20.0000 mg | ORAL_TABLET | Freq: Every day | ORAL | 1 refills | Status: DC
Start: 2021-06-27 — End: 2021-10-16

## 2021-06-27 MED ORDER — LISINOPRIL 20 MG PO TABS: 20.0000 mg | ORAL_TABLET | Freq: Every day | ORAL | 0 refills | Status: DC

## 2021-06-27 NOTE — Patient Instructions (Signed)
Great to see you today.  I have refilled the medication(s) we provide.   If labs were collected, we will inform you of lab results once received either by echart message or telephone call.   - echart message- for normal results that have been seen by the patient already.   - telephone call: abnormal results or if patient has not viewed results in their echart.

## 2021-06-27 NOTE — Progress Notes (Signed)
This visit occurred during the SARS-CoV-2 public health emergency.  Safety protocols were in place, including screening questions prior to the visit, additional usage of staff PPE, and extensive cleaning of exam room while observing appropriate contact time as indicated for disinfecting solutions.    Patient ID: Kayla Reeves, female  DOB: [birth year], 45 y.o.   MRN: [id number] Patient Care Team    Relationship Specialty Notifications Start End  Ma Hillock, DO PCP - General Family Medicine  11/10/18   Ma Hillock, DO PCP - Family Medicine Family Medicine  07/05/15   Megan Salon, MD Consulting Physician Gynecology  11/03/18     Chief Complaint  Patient presents with   Hypertension    [place]; pt is not fasting    Subjective: Kayla Reeves is a 45 y.o.  Female  present for [facility] Hypertension/morbid obesity: Pt reports compliance with Lisinopril 20 mg QD without side effects.Patient denies chest pain, shortness of breath, dizziness or lower extremity edema.  Diet: does not routinely watch Exercise: yoga RF: HTN, obesity, FHX HD and stroke  Depression screen [facility] 2/9 12/01/2020 11/19/2019 11/03/2018 04/02/2018 12/11/2017  Decreased Interest 0 0 0 0 0  Down, Depressed, Hopeless 0 0 0 0 0  PHQ - 2 Score 0 0 0 0 0   No flowsheet data found.   Immunization History  Administered Date(s) Administered   Influenza,inj,Quad PF,6+ Mos 11/19/2019, 12/01/2020   PFIZER(Purple Top)SARS-COV-2 Vaccination 01/29/2020, 02/29/2020   Tdap 11/04/2006, 11/03/2017   Past Medical History:  Diagnosis Date   Hypertension    Seasonal allergies    Urinary incontinence    No Known Allergies Past Surgical History:  Procedure Laterality Date   NO PAST SURGERIES     Family History  Problem Relation Age of Onset   Diabetes Father    Aneurysm Father        brain   Polycythemia Father    Stroke Mother 27   High blood pressure Mother    Rheum arthritis Mother    Hypertension Mother     Mitral valve prolapse Mother    Breast cancer Sister        breast   COPD Maternal Grandmother    Stroke Paternal Grandmother    Heart disease Paternal Grandfather    Breast cancer Maternal Aunt    Social History   Social History Narrative   Ms Dershem lives with husband & 3 kids.     Allergies as of 06/27/2021   No Known Allergies      Medication List        Accurate as of June 27, 2021  1:51 PM. If you have any questions, ask your nurse or doctor.          lisinopril 20 MG tablet Commonly known as: ZESTRIL Take 1 tablet (20 mg total) by mouth daily. What changed: Another medication with the same name was added. Make sure you understand how and when to take each. Changed by: Howard Pouch, DO   lisinopril 20 MG tablet Commonly known as: ZESTRIL Take 1 tablet (20 mg total) by mouth daily. What changed: You were already taking a medication with the same name, and this prescription was added. Make sure you understand how and when to take each. Changed by: Howard Pouch, DO   MULTIVITAMIN ADULT PO Take 1 tablet by mouth daily.   vitamin D3 50 MCG (2000 UT) Caps Take by mouth daily.        All past  medical history, surgical history, allergies, family history, immunizations andmedications were updated in the EMR today and reviewed under the history and medication portions of their EMR.     No results found for this or any previous visit (from the past 2160 hour(s)).  ROS: 14 pt review of systems performed and negative (unless mentioned in an HPI)  Objective: BP 135/79   Pulse 70   Temp 97.9 F (36.6 C) (Oral)   Ht 5' 2"  (1.575 m)   Wt 171 lb (77.6 kg)   SpO2 99%   BMI 31.28 kg/m  Gen: Afebrile. No acute distress. Very pleasant obese female.  HENT: AT. [place].  Eyes:Pupils Equal Round Reactive to light, Extraocular movements intact,  Conjunctiva without redness, discharge or icterus. Neck/lymp/endocrine: Supple,no lymphadenopathy, no thyromegaly CV: RRR no  murmur, no edema, +2/4 P posterior tibialis pulses Chest: CTAB, no wheeze or crackles Neuro:  Normal gait. PERLA. EOMi. Alert. Oriented x3  Psych: Normal affect, dress and demeanor. Normal speech. Normal thought content and judgment..   No results found.  Assessment/plan: Kayla Reeves is a 45 y.o. female present for [facility] Essential hypertension/Obesity (BMI 30-39.9) - stable.  - continue lisinopril to 20 mg QD.  - low sodium. Exercise encouraged.  - f/u 5.5 mos. CMC/CPE with fasting labs.   Vitamin D deficiency - continue supplementing with 2000u daily.  - labs due next visit.    Return in about 5 months (around 12/10/2021) for CPE (30 min), CMC (30 min).  No orders of the defined types were placed in this encounter.  Meds ordered this encounter  Medications   lisinopril (ZESTRIL) 20 MG tablet    Sig: Take 1 tablet (20 mg total) by mouth daily.    Dispense:  90 tablet    Refill:  1   lisinopril (ZESTRIL) 20 MG tablet    Sig: Take 1 tablet (20 mg total) by mouth daily.    Dispense:  30 tablet    Refill:  0    Referral Orders  No referral(s) requested today     Electronically signed by: Howard Pouch, [place]

## 2021-09-03 ENCOUNTER — Encounter: Payer: Self-pay | Admitting: Family Medicine

## 2021-09-03 ENCOUNTER — Telehealth (INDEPENDENT_AMBULATORY_CARE_PROVIDER_SITE_OTHER): Payer: 59 | Admitting: Family Medicine

## 2021-09-03 VITALS — Temp 99.6°F | Wt 171.0 lb

## 2021-09-03 DIAGNOSIS — B349 Viral infection, unspecified: Secondary | ICD-10-CM

## 2021-09-03 DIAGNOSIS — J069 Acute upper respiratory infection, unspecified: Secondary | ICD-10-CM

## 2021-09-03 DIAGNOSIS — J0100 Acute maxillary sinusitis, unspecified: Secondary | ICD-10-CM

## 2021-09-03 DIAGNOSIS — R110 Nausea: Secondary | ICD-10-CM | POA: Diagnosis not present

## 2021-09-03 MED ORDER — PREDNISONE 20 MG PO TABS: ORAL_TABLET | ORAL | 0 refills | Status: DC

## 2021-09-03 MED ORDER — BENZONATATE 200 MG PO CAPS
200.0000 mg | ORAL_CAPSULE | Freq: Three times a day (TID) | ORAL | 0 refills | Status: DC | PRN
Start: 2021-09-03 — End: 2021-10-16

## 2021-09-03 MED ORDER — PROMETHAZINE HCL 12.5 MG PO TABS: ORAL_TABLET | ORAL | 0 refills | Status: DC

## 2021-09-03 MED ORDER — DOXYCYCLINE HYCLATE 100 MG PO CAPS: 100.0000 mg | ORAL_CAPSULE | Freq: Two times a day (BID) | ORAL | 0 refills | Status: AC

## 2021-09-03 NOTE — Progress Notes (Signed)
Virtual Visit via Video Note  I connected with pt on 09/03/21 at  9:30 AM EDT by a video enabled telemedicine application and verified that I am speaking with the correct person using two identifiers.  Location patient: home, [place] Location provider:work or home office Persons participating in the virtual visit: patient, provider  I discussed the limitations of evaluation and management by telemedicine and the availability of in person appointments. The patient expressed understanding and agreed to proceed.  Telemedicine visit is a necessity given the COVID-19 restrictions in place at the current time.  HPI: 45 y/o female with respiratory concerns. Onset 10 days ago of nasal congestion, runny nose, sore throat, headache, and cough.  The symptoms were not associated with fever, and they resolved in a few days.  Then about a day later she got return of all symptoms significantly.  T-max 99.6.  She has been taking over-the-counter Coricidin for symptoms.  She has significant nausea but is able to drink fluids fine.  She cannot keep solid food down.  Has a little bit of diarrhea as well.  Denies shortness of breath.  She describes some upper airway wheezing sounds.   ROS: See pertinent positives and negatives per HPI.  Past Medical History:  Diagnosis Date   Hypertension    Seasonal allergies    Urinary incontinence     Past Surgical History:  Procedure Laterality Date   NO PAST SURGERIES       Current Outpatient Medications:    lisinopril (ZESTRIL) 20 MG tablet, Take 1 tablet (20 mg total) by mouth daily., Disp: 90 tablet, Rfl: 1   lisinopril (ZESTRIL) 20 MG tablet, Take 1 tablet (20 mg total) by mouth daily., Disp: 30 tablet, Rfl: 0   Multiple Vitamins-Minerals (MULTIVITAMIN ADULT PO), Take 1 tablet by mouth daily., Disp: , Rfl:    Vitamin D, Cholecalciferol, 50 MCG (2000 UT) CAPS, Take by mouth daily., Disp: , Rfl:   EXAM:  VITALS per patient if applicable:  Vitals with BMI  06/27/2021 12/01/2020 07/03/2020  Height 5' 2"  5' 2"  -  Weight 171 lbs 170 lbs -  BMI 78.24 23.53 -  Systolic 614 431 540  Diastolic 79 85 84  Pulse 70 81 -     GENERAL: alert, oriented, appears tired but nontoxic and in no acute distress  HEENT: atraumatic, conjunttiva clear, no obvious abnormalities on inspection of external nose and ears Sounds like nose congested. Coughing some--rattle.  NECK: normal movements of the head and neck  LUNGS: on inspection no signs of respiratory distress, breathing rate appears normal, no obvious gross SOB, gasping or wheezing  CV: no obvious cyanosis  MS: moves all visible extremities without noticeable abnormality  PSYCH/NEURO: pleasant and cooperative, no obvious depression or anxiety, speech and thought processing grossly intact  LABS: none today    Chemistry      Component Value Date/Time   NA 137 12/01/2020 0838   K 4.3 12/01/2020 0838   CL 103 12/01/2020 0838   CO2 27 12/01/2020 0838   BUN 16 12/01/2020 0838   CREATININE 0.72 12/01/2020 0838      Component Value Date/Time   CALCIUM 9.6 12/01/2020 0838   ALKPHOS 51 12/01/2020 0838   AST 18 12/01/2020 0838   ALT 15 12/01/2020 0838   BILITOT 0.5 12/01/2020 0838      ASSESSMENT AND PLAN:  Discussed the following assessment and plan:  #1 nonspecific URI with cough and congestion.  Has flulike illness, but seems to have had  onset of a secondary acute bacterial sinusitis.  She is about 10 days into the illness now, with some ongoing nausea and no significant improvement in symptoms.  We will treat with doxycycline 100 mg twice a day for 7 days, prednisone 40 mg a day for 5 days, Tessalon Perles 200 mg 3 times daily as needed cough.  She will continue Coricidin with Tylenol. I prescribed promethazine 12.5 mg, to take 1-2 tabs every 6 hours as needed for nausea.  Importance of hydration and rest emphasized. Signs/symptoms to call or return for were reviewed and pt expressed  understanding.   I discussed the assessment and treatment plan with the patient. The patient was provided an opportunity to ask questions and all were answered. The patient agreed with the plan and demonstrated an understanding of the instructions.   F/u: if not improving in next 4-5d  Signed:  Crissie Sickles, MD           09/03/2021

## 2021-10-15 ENCOUNTER — Telehealth: Payer: Self-pay

## 2021-10-15 IMAGING — MG MM DIGITAL DIAGNOSTIC UNILAT*L* W/ TOMO W/ CAD
4 series · 4 of 12 positions shown · non-contrast
Comparison: Previous exam(s).

CLINICAL DATA: Screening recall for a possible left breast mass.

EXAM:
DIGITAL DIAGNOSTIC UNILATERAL LEFT MAMMOGRAM WITH CAD AND TOMO

[L CC synth-2D]
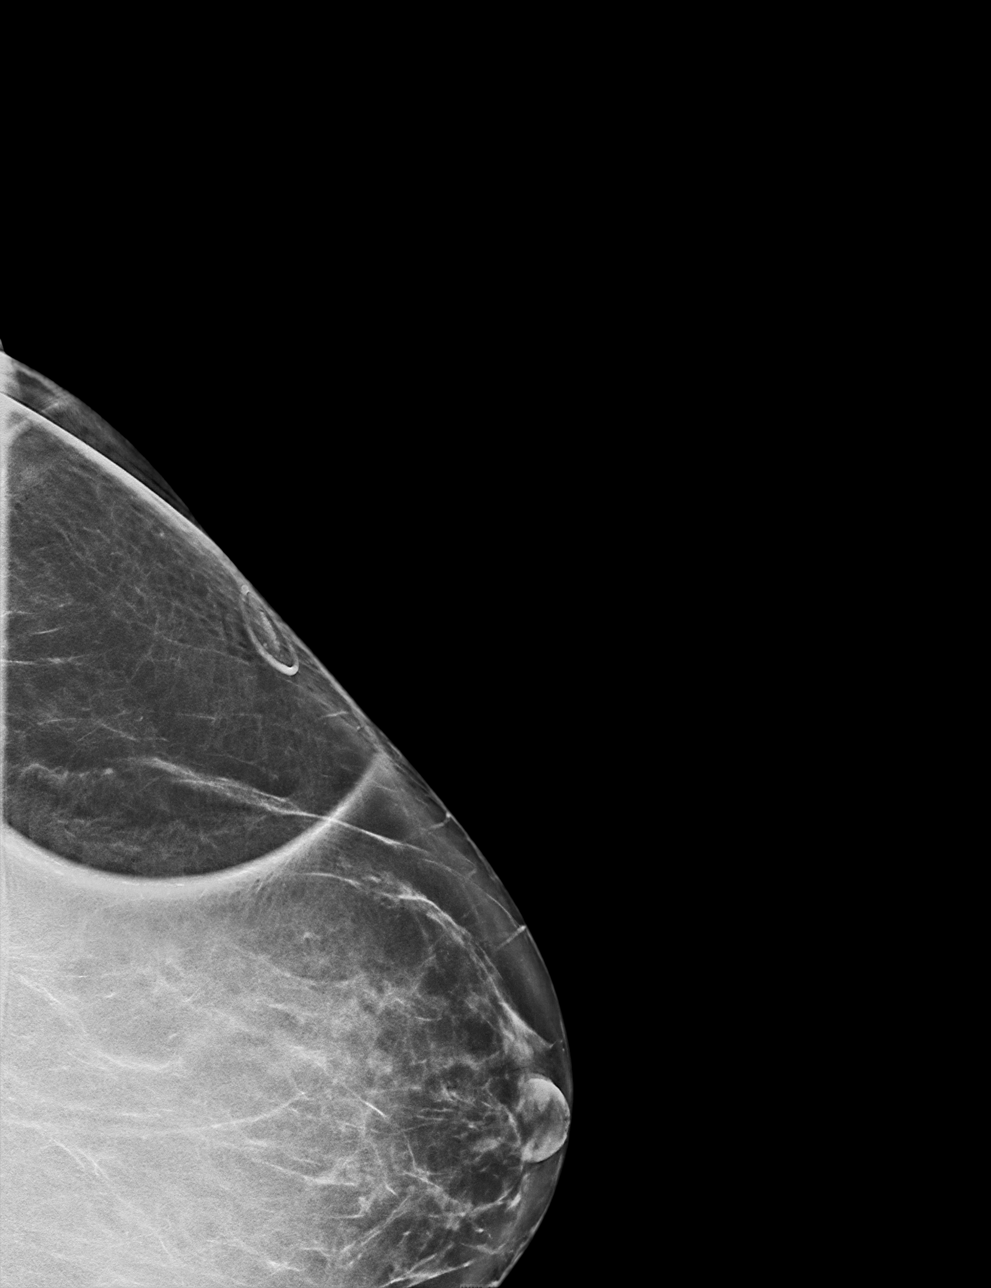

[L MLO synth-2D]
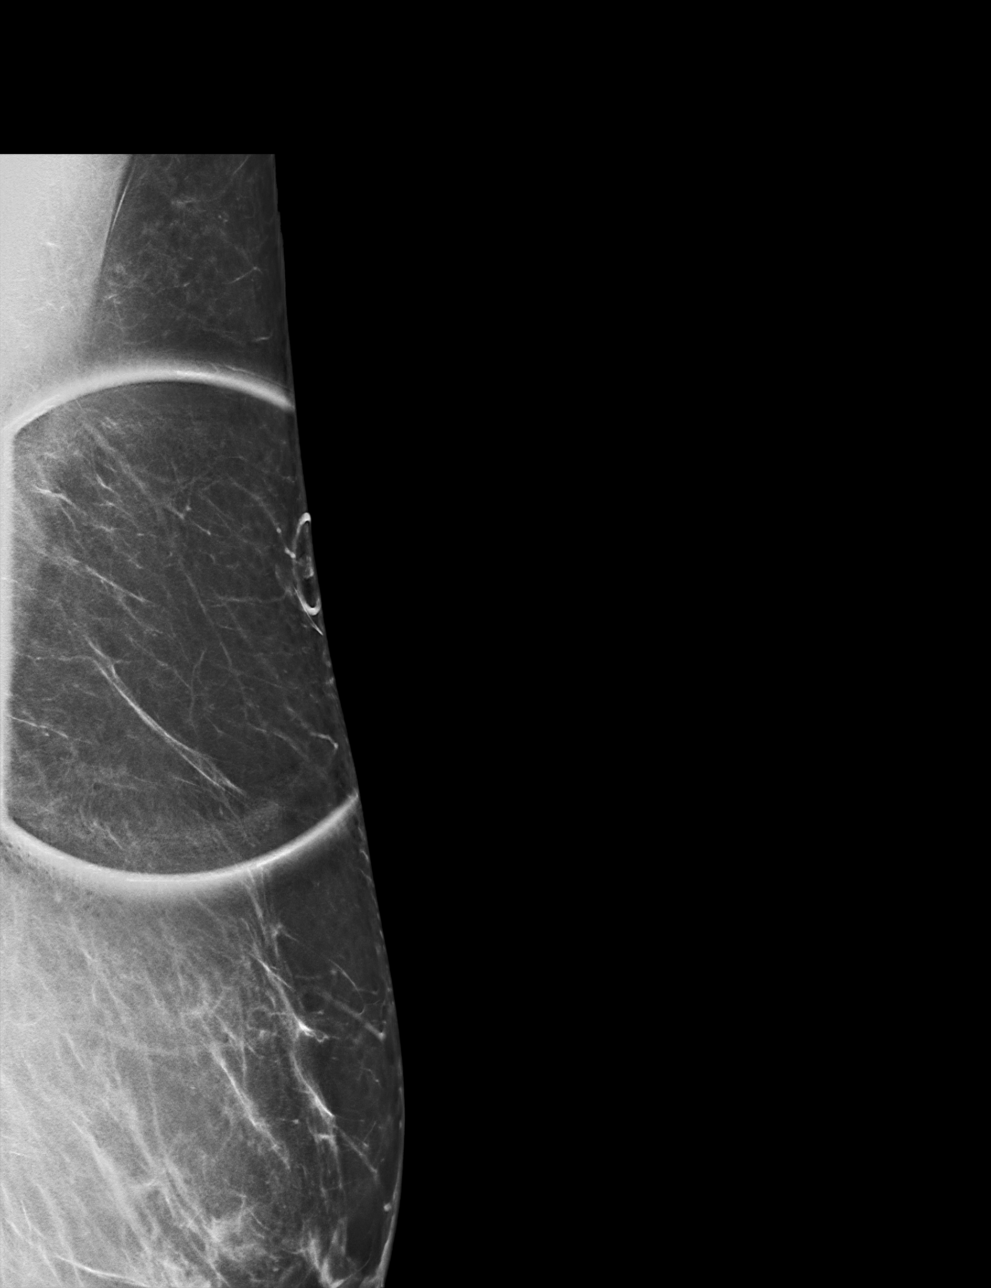

[L CC tomo · tomo slice 39/76.0]
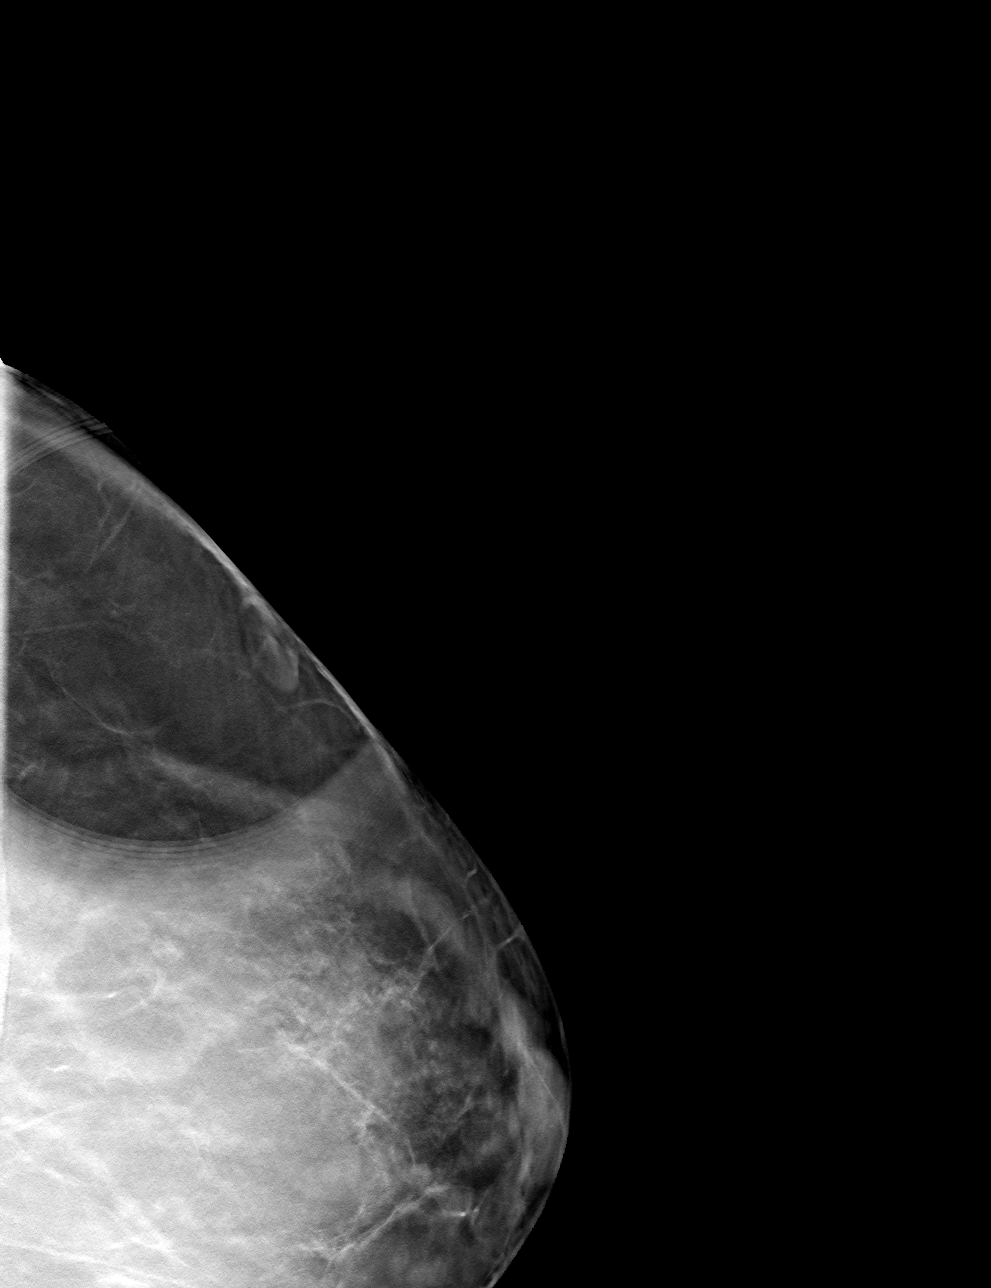

[L MLO tomo · tomo slice 42/83.0]
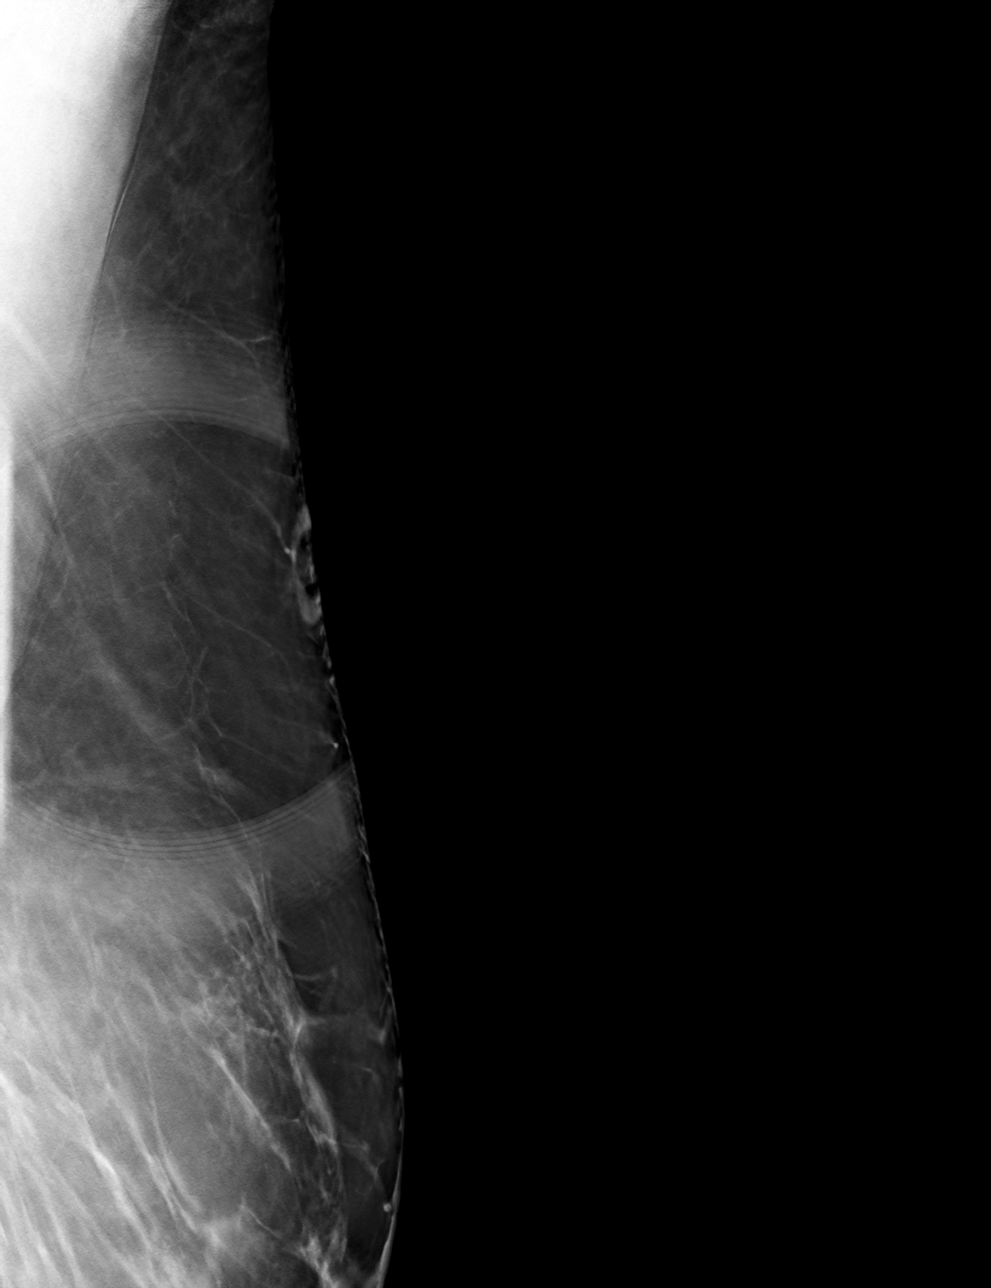

[4 of 12 positions shown; findings below may reference images not displayed]

ACR Breast Density Category b: There are scattered areas of
fibroglandular density.
FINDINGS: Spot compression tomosynthesis imaging with a mole marker marking a
skin lesion on the upper-outer left breast demonstrates that the
mass seen on the screening mammogram does correspond with this skin
lesion. No suspicious calcifications, masses or areas of distortion
are seen in the left breast.

Mammographic images were processed with CAD.
IMPRESSION: The left breast mass on the screening mammogram corresponds with a
skin lesion.

RECOMMENDATION:
Screening mammogram in one year.(Code:[id number])

I have discussed the findings and recommendations with the patient.
If applicable, a reminder letter will be sent to the patient
regarding the next appointment.

BI-RADS CATEGORY  2: Benign.

## 2021-10-15 NOTE — Telephone Encounter (Signed)
LVM for pt to CB regarding upcoming appt.   Note: pt is schedule for CPE tomorrow but had CPE already once this year. Would like to know if pt has new insurance.

## 2021-10-16 ENCOUNTER — Other Ambulatory Visit: Payer: Self-pay

## 2021-10-16 ENCOUNTER — Encounter: Payer: Self-pay | Admitting: Family Medicine

## 2021-10-16 ENCOUNTER — Ambulatory Visit (INDEPENDENT_AMBULATORY_CARE_PROVIDER_SITE_OTHER): Payer: 59 | Admitting: Family Medicine

## 2021-10-16 VITALS — BP 125/80 | HR 83 | Temp 98.4°F | Ht 61.5 in | Wt 168.0 lb

## 2021-10-16 DIAGNOSIS — Z131 Encounter for screening for diabetes mellitus: Secondary | ICD-10-CM

## 2021-10-16 DIAGNOSIS — Z23 Encounter for immunization: Secondary | ICD-10-CM

## 2021-10-16 DIAGNOSIS — E559 Vitamin D deficiency, unspecified: Secondary | ICD-10-CM

## 2021-10-16 DIAGNOSIS — I10 Essential (primary) hypertension: Secondary | ICD-10-CM | POA: Diagnosis not present

## 2021-10-16 DIAGNOSIS — Z1231 Encounter for screening mammogram for malignant neoplasm of breast: Secondary | ICD-10-CM | POA: Diagnosis not present

## 2021-10-16 DIAGNOSIS — E669 Obesity, unspecified: Secondary | ICD-10-CM

## 2021-10-16 DIAGNOSIS — Z0000 Encounter for general adult medical examination without abnormal findings: Secondary | ICD-10-CM | POA: Diagnosis not present

## 2021-10-16 LAB — CBC
HCT: 38.3 % (ref 36.0–46.0)
Hemoglobin: 12.4 g/dL (ref 12.0–15.0)
MCHC: 32.3 g/dL (ref 30.0–36.0)
MCV: 89.4 fl (ref 78.0–100.0)
Platelets: 358 10*3/uL (ref 150.0–400.0)
RBC: 4.28 Mil/uL (ref 3.87–5.11)
RDW: 13.9 % (ref 11.5–15.5)
WBC: 9.5 10*3/uL (ref 4.0–10.5)

## 2021-10-16 LAB — COMPREHENSIVE METABOLIC PANEL
ALT: 15 U/L (ref 0–35)
AST: 15 U/L (ref 0–37)
Albumin: 4.1 g/dL (ref 3.5–5.2)
Alkaline Phosphatase: 54 U/L (ref 39–117)
BUN: 11 mg/dL (ref 6–23)
CO2: 26 mEq/L (ref 19–32)
Calcium: 9.4 mg/dL (ref 8.4–10.5)
Chloride: 102 mEq/L (ref 96–112)
Creatinine, Ser: 0.72 mg/dL (ref 0.40–1.20)
GFR: 99.32 mL/min (ref >60.00)
Glucose, Bld: 91 mg/dL (ref 70–99)
Potassium: 4.2 mEq/L (ref 3.5–5.1)
Sodium: 136 mEq/L (ref 135–145)
Total Bilirubin: 0.6 mg/dL (ref 0.2–1.2)
Total Protein: 7.5 g/dL (ref 6.0–8.3)

## 2021-10-16 LAB — TSH: TSH: 2.18 u[IU]/mL (ref 0.35–5.50)

## 2021-10-16 LAB — LIPID PANEL
Cholesterol: 170 mg/dL (ref 0–200)
HDL: 60.8 mg/dL (ref >39.00)
LDL Cholesterol: 93 mg/dL (ref 0–99)
NonHDL: 109.5
Total CHOL/HDL Ratio: 3
Triglycerides: 85 mg/dL (ref 0.0–149.0)
VLDL: 17 mg/dL (ref 0.0–40.0)

## 2021-10-16 LAB — HEMOGLOBIN A1C: Hgb A1c MFr Bld: 5.6 % (ref 4.6–6.5)

## 2021-10-16 LAB — VITAMIN D 25 HYDROXY (VIT D DEFICIENCY, FRACTURES): VITD: 32.12 ng/mL (ref 30.00–100.00)

## 2021-10-16 MED ORDER — FLUTICASONE PROPIONATE 50 MCG/ACT NA SUSP: 2.0000 | Freq: Two times a day (BID) | NASAL | 6 refills | Status: AC

## 2021-10-16 MED ORDER — LISINOPRIL 20 MG PO TABS: 20.0000 mg | ORAL_TABLET | Freq: Every day | ORAL | 1 refills | Status: DC

## 2021-10-16 MED ORDER — LEVOCETIRIZINE DIHYDROCHLORIDE 5 MG PO TABS: 5.0000 mg | ORAL_TABLET | Freq: Every evening | ORAL | 5 refills | Status: AC

## 2021-10-16 MED ORDER — OMEPRAZOLE 20 MG PO CPDR: 20.0000 mg | DELAYED_RELEASE_CAPSULE | Freq: Every day | ORAL | 2 refills | Status: DC

## 2021-10-16 NOTE — Patient Instructions (Signed)
Great to see you today.  I have refilled the medication(s) we provide.   If labs were collected, we will inform you of lab results once received either by echart message or telephone call.   - echart message- for normal results that have been seen by the patient already.   - telephone call: abnormal results or if patient has not viewed results in their echart.  Health Maintenance, Female Adopting a healthy lifestyle and getting preventive care are important in promoting health and wellness. Ask your health care provider about: The right schedule for you to have regular tests and exams. Things you can do on your own to prevent diseases and keep yourself healthy. What should I know about diet, weight, and exercise? Eat a healthy diet  Eat a diet that includes plenty of vegetables, fruits, low-fat dairy products, and lean protein. Do not eat a lot of foods that are high in solid fats, added sugars, or sodium. Maintain a healthy weight Body mass index (BMI) is used to identify weight problems. It estimates body fat based on height and weight. Your health care provider can help determine your BMI and help you achieve or maintain a healthy weight. Get regular exercise Get regular exercise. This is one of the most important things you can do for your health. Most adults should: Exercise for at least 150 minutes each week. The exercise should increase your heart rate and make you sweat (moderate-intensity exercise). Do strengthening exercises at least twice a week. This is in addition to the moderate-intensity exercise. Spend less time sitting. Even light physical activity can be beneficial. Watch cholesterol and blood lipids Have your blood tested for lipids and cholesterol at 47 years of age, then have this test every 5 years. Have your cholesterol levels checked more often if: Your lipid or cholesterol levels are high. You are older than 47 years of age. You are at high risk for heart  disease. What should I know about cancer screening? Depending on your health history and family history, you may need to have cancer screening at various ages. This may include screening for: Breast cancer. Cervical cancer. Colorectal cancer. Skin cancer. Lung cancer. What should I know about heart disease, diabetes, and high blood pressure? Blood pressure and heart disease High blood pressure causes heart disease and increases the risk of stroke. This is more likely to develop in people who have high blood pressure readings or are overweight. Have your blood pressure checked: Every 3-5 years if you are 105-19 years of age. Every year if you are 51 years old or older. Diabetes Have regular diabetes screenings. This checks your fasting blood sugar level. Have the screening done: Once every three years after age 74 if you are at a normal weight and have a low risk for diabetes. More often and at a younger age if you are overweight or have a high risk for diabetes. What should I know about preventing infection? Hepatitis B If you have a higher risk for hepatitis B, you should be screened for this virus. Talk with your health care provider to find out if you are at risk for hepatitis B infection. Hepatitis C Testing is recommended for: Everyone born from [birth year] through 1967. Anyone with known risk factors for hepatitis C. Sexually transmitted infections (STIs) Get screened for STIs, including gonorrhea and chlamydia, if: You are sexually active and are younger than 47 years of age. You are older than 48 years of age and your health care provider  tells you that you are at risk for this type of infection. Your sexual activity has changed since you were last screened, and you are at increased risk for chlamydia or gonorrhea. Ask your health care provider if you are at risk. Ask your health care provider about whether you are at high risk for HIV. Your health care provider may recommend a  prescription medicine to help prevent HIV infection. If you choose to take medicine to prevent HIV, you should first get tested for HIV. You should then be tested every 3 months for as long as you are taking the medicine. Pregnancy If you are about to stop having your period (premenopausal) and you may become pregnant, seek counseling before you get pregnant. Take 400 to 800 micrograms (mcg) of folic acid every day if you become pregnant. Ask for birth control (contraception) if you want to prevent pregnancy. Osteoporosis and menopause Osteoporosis is a disease in which the bones lose minerals and strength with aging. This can result in bone fractures. If you are 58 years old or older, or if you are at risk for osteoporosis and fractures, ask your health care provider if you should: Be screened for bone loss. Take a calcium or vitamin D supplement to lower your risk of fractures. Be given hormone replacement therapy (HRT) to treat symptoms of menopause. Follow these instructions at home: Alcohol use Do not drink alcohol if: Your health care provider tells you not to drink. You are pregnant, may be pregnant, or are planning to become pregnant. If you drink alcohol: Limit how much you have to: 0-1 drink a day. Know how much alcohol is in your drink. In the [place] one drink equals one 12 oz bottle of beer (355 mL), one 5 oz glass of wine (148 mL), or one 1 oz glass of hard liquor (44 mL). Lifestyle Do not use any products that contain nicotine or tobacco. These products include cigarettes, chewing tobacco, and vaping devices, such as e-cigarettes. If you need help quitting, ask your health care provider. Do not use street drugs. Do not share needles. Ask your health care provider for help if you need support or information about quitting drugs. General instructions Schedule regular health, dental, and eye exams. Stay current with your vaccines. Tell your health care provider if: You often  feel depressed. You have ever been abused or do not feel safe at home. Summary Adopting a healthy lifestyle and getting preventive care are important in promoting health and wellness. Follow your health care provider's instructions about healthy diet, exercising, and getting tested or screened for diseases. Follow your health care provider's instructions on monitoring your cholesterol and blood pressure. This information is not intended to replace advice given to you by your health care provider. Make sure you discuss any questions you have with your health care provider. Document Revised: 03/12/2021 Document Reviewed: 03/12/2021 Elsevier Patient Education  [place].

## 2021-10-16 NOTE — Progress Notes (Signed)
This visit occurred during the SARS-CoV-2 public health emergency.  Safety protocols were in place, including screening questions prior to the visit, additional usage of staff PPE, and extensive cleaning of exam room while observing appropriate contact time as indicated for disinfecting solutions.    Patient ID: Kayla Reeves, female  DOB: June 18, 1976, 45 y.o.   MRN: [id number] Patient Care Team    Relationship Specialty Notifications Start End  Ma Hillock, DO PCP - General Family Medicine  11/10/18   Ma Hillock, DO PCP - Family Medicine Family Medicine  07/05/15   Megan Salon, MD Consulting Physician Gynecology  11/03/18     Chief Complaint  Patient presents with   Annual Exam    Pt is fasting    Subjective: Kayla Reeves is a 45 y.o.  Female  present for CPE/CMC. All past medical history, surgical history, allergies, family history, immunizations, medications and social history were updated in the electronic medical record today. All recent labs, ED visits and hospitalizations within the last year were reviewed.  Health maintenance:  Colonoscopy: No Fhx, Cologuard completed 12/2019- negative- rpt 3 yr(2024) Mammogram: Completed  02/05/2021-[facility] > ordered Cervical cancer screening: last pap 11/2018, results: normal, neg HPV, completed by: Dr. Sabra Heck Immunizations: tdap UTD 2018, Influenza- administered today(encouraged yearly), covid completed- booster . Infectious disease screening: HIV completed. Hep C declined DEXA: per routine screening Assistive device: none Oxygen use: none Patient has a Dental home. Hospitalizations/ED visits: reviewed   Hypertension/morbid obesity: Pt reports compliance  with Lisinopril 20 mg QD without side effects.. Patient denies chest pain, shortness of breath, dizziness or lower extremity edema.  Exercise: yoga  Depression screen [facility] 2/9 10/16/2021 09/03/2021 12/01/2020 11/19/2019 11/03/2018  Decreased Interest 0 0 0 0 0  Down,  Depressed, Hopeless 0 0 0 0 0  PHQ - 2 Score 0 0 0 0 0   No flowsheet data found.   Immunization History  Administered Date(s) Administered   Influenza,inj,Quad PF,6+ Mos 11/19/2019, 12/01/2020, 10/16/2021   PFIZER(Purple Top)SARS-COV-2 Vaccination 01/29/2020, 02/29/2020   Tdap 11/04/2006, 11/03/2017   Past Medical History:  Diagnosis Date   Hypertension    Seasonal allergies    Urinary incontinence    No Known Allergies Past Surgical History:  Procedure Laterality Date   NO PAST SURGERIES     Family History  Problem Relation Age of Onset   Diabetes Father    Aneurysm Father        brain   Polycythemia Father    Stroke Mother 43   High blood pressure Mother    Rheum arthritis Mother    Hypertension Mother    Mitral valve prolapse Mother    Breast cancer Sister        breast   COPD Maternal Grandmother    Stroke Paternal Grandmother    Heart disease Paternal Grandfather    Breast cancer Maternal Aunt    Social History   Social History Narrative   [name] lives with husband & 3 kids.     Allergies as of 10/16/2021   No Known Allergies      Medication List        Accurate as of October 16, 2021 10:43 AM. If you have any questions, ask your nurse or doctor.          STOP taking these medications    benzonatate 200 MG capsule Commonly known as: TESSALON Stopped by: Howard Pouch, DO   predniSONE 20 MG tablet Commonly known  as: DELTASONE Stopped by: Howard Pouch, DO   promethazine 12.5 MG tablet Commonly known as: PHENERGAN Stopped by: Howard Pouch, DO       TAKE these medications    fluticasone 50 MCG/ACT nasal spray Commonly known as: FLONASE Place 2 sprays into both nostrils 2 (two) times daily. Started by: Howard Pouch, DO   levocetirizine 5 MG tablet Commonly known as: XYZAL Take 1 tablet (5 mg total) by mouth every evening. Started by: Howard Pouch, DO   lisinopril 20 MG tablet Commonly known as: ZESTRIL Take 1 tablet (20  mg total) by mouth daily.   MULTIVITAMIN ADULT PO Take 1 tablet by mouth daily.   omeprazole 20 MG capsule Commonly known as: PRILOSEC Take 1 capsule (20 mg total) by mouth daily. Started by: Howard Pouch, DO   vitamin D3 50 MCG (2000 UT) Caps Take by mouth daily.        All past medical history, surgical history, allergies, family history, immunizations andmedications were updated in the EMR today and reviewed under the history and medication portions of their EMR.     No results found for this or any previous visit (from the past 2160 hour(s)).  MM 3D SCREEN BREAST BILATERAL  Result Date: 02/06/2021 CLINICAL DATA:  Screening. EXAM: DIGITAL SCREENING BILATERAL MAMMOGRAM WITH TOMOSYNTHESIS AND CAD TECHNIQUE: Bilateral screening digital craniocaudal and mediolateral oblique mammograms were obtained. Bilateral screening digital breast tomosynthesis was performed. The images were evaluated with computer-aided detection. COMPARISON:  Previous exam(s). ACR Breast Density Category b: There are scattered areas of fibroglandular density. FINDINGS: There are no findings suspicious for malignancy. The images were evaluated with computer-aided detection. IMPRESSION: No mammographic evidence of malignancy. A result letter of this screening mammogram will be mailed directly to the patient. RECOMMENDATION: Screening mammogram in one year. (Code:SM-B-01Y) BI-RADS CATEGORY  1: Negative. Electronically Signed   By: Kristopher Oppenheim M.D.   On: 02/06/2021 13:26     ROS 14 pt review of systems performed and negative (unless mentioned in an HPI)  Objective:  BP 125/80    Pulse 83    Temp 98.4 F (36.9 C) (Oral)    Ht 5' 1.5" (1.562 m)    Wt 168 lb (76.2 kg)    LMP 09/28/2021    SpO2 99%    BMI 31.23 kg/m   Physical Exam Constitutional:      General: She is not in acute distress.    Appearance: Normal appearance. She is not ill-appearing or toxic-appearing.  HENT:     Head: Normocephalic and  atraumatic.     Right Ear: Tympanic membrane, ear canal and external ear normal. There is no impacted cerumen.     Left Ear: Tympanic membrane, ear canal and external ear normal. There is no impacted cerumen.     Nose: No congestion or rhinorrhea.     Mouth/Throat:     Mouth: Mucous membranes are moist.     Pharynx: Oropharynx is clear. No oropharyngeal exudate or posterior oropharyngeal erythema.  Eyes:     General: No scleral icterus.       Right eye: No discharge.        Left eye: No discharge.     Extraocular Movements: Extraocular movements intact.     Conjunctiva/sclera: Conjunctivae normal.     Pupils: Pupils are equal, round, and reactive to light.  Cardiovascular:     Rate and Rhythm: Normal rate and regular rhythm.     Pulses: Normal pulses.     Heart  sounds: Normal heart sounds. No murmur heard.   No friction rub. No gallop.  Pulmonary:     Effort: Pulmonary effort is normal. No respiratory distress.     Breath sounds: Normal breath sounds. No stridor. No wheezing, rhonchi or rales.  Chest:     Chest wall: No tenderness.  Abdominal:     General: Abdomen is flat. Bowel sounds are normal. There is no distension.     Palpations: Abdomen is soft. There is no mass.     Tenderness: There is no abdominal tenderness. There is no right CVA tenderness, left CVA tenderness, guarding or rebound.     Hernia: No hernia is present.  Musculoskeletal:        General: No swelling, tenderness or deformity. Normal range of motion.     Cervical back: Normal range of motion and neck supple. No rigidity or tenderness.     Right lower leg: No edema.     Left lower leg: No edema.  Lymphadenopathy:     Cervical: No cervical adenopathy.  Skin:    General: Skin is warm and dry.     Coloration: Skin is not jaundiced or pale.     Findings: No bruising, erythema, lesion or rash.  Neurological:     General: No focal deficit present.     Mental Status: She is alert and oriented to person, place,  and time. Mental status is at baseline.     Cranial Nerves: No cranial nerve deficit.     Sensory: No sensory deficit.     Motor: No weakness.     Coordination: Coordination normal.     Gait: Gait normal.     Deep Tendon Reflexes: Reflexes normal.  Psychiatric:        Mood and Affect: Mood normal.        Behavior: Behavior normal.        Thought Content: Thought content normal.        Judgment: Judgment normal.     No results found.  Assessment/plan: Brynna Dobos is a 45 y.o. female present for CPE/CMC Vitamin D deficiency - VITAMIN D 25 Hydroxy (Vit-D Deficiency, Fractures) Essential hypertension/Obesity (BMI 30-39.9) Stable.  Continue lisinopril 20 mg qd - CBC - Comprehensive metabolic panel - Lipid panel - TSH Diabetes mellitus screening - Hemoglobin A1c Breast cancer screening by mammogram - MM 3D SCREEN BREAST BILATERAL; Future Need for influenza vaccination - Flu Vaccine QUAD 6+ mos PF IM (Fluarix Quad PF) Routine general medical examination at a health care facility Colonoscopy: No Fhx, Cologuard completed 12/2019- negative- rpt 3 yr(2024) Mammogram: Completed  02/05/2021-[facility] > ordered Cervical cancer screening: last pap 11/2018, results: normal, neg HPV, completed by: Dr. Sabra Heck Immunizations: tdap UTD 2018, Influenza- administered today(encouraged yearly), covid completed- booster . Infectious disease screening: HIV completed. Hep C declined DEXA: per routine screening Patient was encouraged to exercise greater than 150 minutes a week. Patient was encouraged to choose a diet filled with fresh fruits and vegetables, and lean meats. AVS provided to patient today for education/recommendation on gender specific health and safety maintenance. Return in about 24 weeks (around 04/02/2022) for CMC (30 min).  Orders Placed This Encounter  Procedures   MM 3D SCREEN BREAST BILATERAL   Flu Vaccine QUAD 6+ mos PF IM (Fluarix Quad PF)   CBC   Hemoglobin A1c    Comprehensive metabolic panel   Lipid panel   TSH   VITAMIN D 25 Hydroxy (Vit-D Deficiency, Fractures)    Meds ordered  this encounter  Medications   lisinopril (ZESTRIL) 20 MG tablet    Sig: Take 1 tablet (20 mg total) by mouth daily.    Dispense:  90 tablet    Refill:  1   levocetirizine (XYZAL) 5 MG tablet    Sig: Take 1 tablet (5 mg total) by mouth every evening.    Dispense:  30 tablet    Refill:  5   fluticasone (FLONASE) 50 MCG/ACT nasal spray    Sig: Place 2 sprays into both nostrils 2 (two) times daily.    Dispense:  16 g    Refill:  6   omeprazole (PRILOSEC) 20 MG capsule    Sig: Take 1 capsule (20 mg total) by mouth daily.    Dispense:  30 capsule    Refill:  2    Referral Orders  No referral(s) requested today     Electronically signed by: Howard Pouch, [place]

## 2022-02-11 ENCOUNTER — Ambulatory Visit
Admission: RE | Admit: 2022-02-11 | Discharge: 2022-02-11 | Disposition: A | Discharge status: Home / Self Care | Payer: 59 | Source: Ambulatory Visit | Attending: Family Medicine | Admitting: Family Medicine

## 2022-02-11 DIAGNOSIS — Z1231 Encounter for screening mammogram for malignant neoplasm of breast: Secondary | ICD-10-CM

## 2022-04-02 ENCOUNTER — Ambulatory Visit (INDEPENDENT_AMBULATORY_CARE_PROVIDER_SITE_OTHER): Payer: Self-pay | Admitting: Family Medicine

## 2022-04-02 DIAGNOSIS — I10 Essential (primary) hypertension: Secondary | ICD-10-CM

## 2022-04-02 DIAGNOSIS — E669 Obesity, unspecified: Secondary | ICD-10-CM

## 2022-04-02 DIAGNOSIS — Z91199 Patient's noncompliance with other medical treatment and regimen due to unspecified reason: Secondary | ICD-10-CM

## 2022-04-02 DIAGNOSIS — E559 Vitamin D deficiency, unspecified: Secondary | ICD-10-CM

## 2022-04-02 NOTE — Patient Instructions (Signed)
No follow-ups on file.        Great to see you today.  I have refilled the medication(s) we provide.   If labs were collected, we will inform you of lab results once received either by echart message or telephone call.   - echart message- for normal results that have been seen by the patient already.   - telephone call: abnormal results or if patient has not viewed results in their echart.

## 2022-04-02 NOTE — Progress Notes (Unsigned)
This visit occurred during the SARS-CoV-2 public health emergency.  Safety protocols were in place, including screening questions prior to the visit, additional usage of staff PPE, and extensive cleaning of exam room while observing appropriate contact time as indicated for disinfecting solutions.    Patient ID: Kayla Reeves, female  DOB: [birth year], 46 y.o.   MRN: [id number] Patient Care Team    Relationship Specialty Notifications Start End  Ma Hillock, DO PCP - General Family Medicine  11/10/18   Ma Hillock, DO PCP - Family Medicine Family Medicine  07/05/15   Megan Salon, MD Consulting Physician Gynecology  11/03/18     No chief complaint on file.   Subjective: Kayla Reeves is a 46 y.o.  Female  present for [facility]. All past medical history, surgical history, allergies, family history, immunizations, medications and social history were updated in the electronic medical record today. All recent labs, ED visits and hospitalizations within the last year were reviewed.     Hypertension/morbid obesity: Pt reports compliance   with Lisinopril 20 mg QD without side effects.. Patient denies chest pain, shortness of breath, dizziness or lower extremity edema.  Exercise: yoga     10/16/2021   10:25 AM 09/03/2021    9:27 AM 12/01/2020    8:12 AM 11/19/2019    9:37 AM 11/03/2018    8:38 AM  Depression screen PHQ 2/9  Decreased Interest 0 0 0 0 0  Down, Depressed, Hopeless 0 0 0 0 0  PHQ - 2 Score 0 0 0 0 0       View : No data to display.           Immunization History  Administered Date(s) Administered   Influenza,inj,Quad PF,6+ Mos 11/19/2019, 12/01/2020, 10/16/2021   PFIZER(Purple Top)SARS-COV-2 Vaccination 01/29/2020, 02/29/2020   Tdap 11/04/2006, 11/03/2017   Past Medical History:  Diagnosis Date   Hypertension    Seasonal allergies    Urinary incontinence    No Known Allergies Past Surgical History:  Procedure Laterality Date   NO PAST  SURGERIES     Family History  Problem Relation Age of Onset   Diabetes Father    Aneurysm Father        brain   Polycythemia Father    Stroke Mother 68   High blood pressure Mother    Rheum arthritis Mother    Hypertension Mother    Mitral valve prolapse Mother    Breast cancer Sister        breast   COPD Maternal Grandmother    Stroke Paternal Grandmother    Heart disease Paternal Grandfather    Breast cancer Maternal Aunt    Social History   Social History Narrative   Ms Kalina lives with husband & 3 kids.     Allergies as of 04/02/2022   No Known Allergies      Medication List        Accurate as of Apr 02, 2022  8:09 AM. If you have any questions, ask your nurse or doctor.          fluticasone 50 MCG/ACT nasal spray Commonly known as: FLONASE Place 2 sprays into both nostrils 2 (two) times daily.   levocetirizine 5 MG tablet Commonly known as: XYZAL Take 1 tablet (5 mg total) by mouth every evening.   lisinopril 20 MG tablet Commonly known as: ZESTRIL Take 1 tablet (20 mg total) by mouth daily.   MULTIVITAMIN ADULT PO Take 1 tablet  by mouth daily.   omeprazole 20 MG capsule Commonly known as: PRILOSEC Take 1 capsule (20 mg total) by mouth daily.   vitamin D3 50 MCG (2000 UT) Caps Take by mouth daily.        All past medical history, surgical history, allergies, family history, immunizations andmedications were updated in the EMR today and reviewed under the history and medication portions of their EMR.     No results found for this or any previous visit (from the past 2160 hour(s)).  MM 3D SCREEN BREAST BILATERAL  Result Date: 02/06/2021 CLINICAL DATA:  Screening. EXAM: DIGITAL SCREENING BILATERAL MAMMOGRAM WITH TOMOSYNTHESIS AND CAD TECHNIQUE: Bilateral screening digital craniocaudal and mediolateral oblique mammograms were obtained. Bilateral screening digital breast tomosynthesis was performed. The images were evaluated with computer-aided  detection. COMPARISON:  Previous exam(s). ACR Breast Density Category b: There are scattered areas of fibroglandular density. FINDINGS: There are no findings suspicious for malignancy. The images were evaluated with computer-aided detection. IMPRESSION: No mammographic evidence of malignancy. A result letter of this screening mammogram will be mailed directly to the patient. RECOMMENDATION: Screening mammogram in one year. (Code:SM-B-01Y) BI-RADS CATEGORY  1: Negative. Electronically Signed   By: Kristopher Oppenheim M.D.   On: 02/06/2021 13:26     ROS 14 pt review of systems performed and negative (unless mentioned in an HPI)  Objective: There were no vitals taken for this visit. Physical Exam Vitals and nursing note reviewed.  Constitutional:      General: She is not in acute distress.    Appearance: Normal appearance. She is not ill-appearing, toxic-appearing or diaphoretic.  HENT:     Head: Normocephalic and atraumatic.  Eyes:     General: No scleral icterus.       Right eye: No discharge.        Left eye: No discharge.     Extraocular Movements: Extraocular movements intact.     Conjunctiva/sclera: Conjunctivae normal.     Pupils: Pupils are equal, round, and reactive to light.  Cardiovascular:     Rate and Rhythm: Normal rate and regular rhythm.  Pulmonary:     Effort: Pulmonary effort is normal. No respiratory distress.     Breath sounds: Normal breath sounds. No wheezing, rhonchi or rales.  Musculoskeletal:     Cervical back: Neck supple. No tenderness.     Right lower leg: No edema.     Left lower leg: No edema.  Lymphadenopathy:     Cervical: No cervical adenopathy.  Skin:    General: Skin is warm and dry.     Coloration: Skin is not jaundiced or pale.     Findings: No erythema or rash.  Neurological:     Mental Status: She is alert and oriented to person, place, and time. Mental status is at baseline.     Motor: No weakness.     Gait: Gait normal.  Psychiatric:         Mood and Affect: Mood normal.        Behavior: Behavior normal.        Thought Content: Thought content normal.        Judgment: Judgment normal.    No results found.  Assessment/plan: Kayla Reeves is a 46 y.o. female present for [facility] Vitamin D deficiency - lab UTD Essential hypertension/Obesity (BMI 30-39.9) *** lisinopril 20 mg qd Low sodium Routine exercise.  F/u 5-6 mos cpe  No follow-ups on file.  No orders of the defined types were placed  in this encounter.   No orders of the defined types were placed in this encounter.   Referral Orders  No referral(s) requested today     Electronically signed by: Howard Pouch, [place]

## 2022-04-15 ENCOUNTER — Encounter: Payer: Self-pay | Admitting: Family Medicine

## 2022-04-15 ENCOUNTER — Telehealth: Payer: Self-pay | Admitting: Family Medicine

## 2022-04-15 NOTE — Telephone Encounter (Signed)
No show history 05/31/2021 04/02/2022  Mailed final warning letter

## 2022-04-15 NOTE — Telephone Encounter (Signed)
noted 

## 2022-05-30 ENCOUNTER — Other Ambulatory Visit: Payer: Self-pay | Admitting: Family Medicine

## 2023-01-03 LAB — COLOGUARD: COLOGUARD: POSITIVE — AB

## 2023-02-24 ENCOUNTER — Other Ambulatory Visit: Payer: Self-pay | Admitting: Physician Assistant

## 2023-02-24 DIAGNOSIS — Z1231 Encounter for screening mammogram for malignant neoplasm of breast: Secondary | ICD-10-CM

## 2023-03-07 ENCOUNTER — Ambulatory Visit
Admission: RE | Admit: 2023-03-07 | Discharge: 2023-03-07 | Disposition: A | Discharge status: Home / Self Care | Payer: 59 | Source: Ambulatory Visit | Attending: Physician Assistant | Admitting: Physician Assistant

## 2023-03-07 DIAGNOSIS — Z1231 Encounter for screening mammogram for malignant neoplasm of breast: Secondary | ICD-10-CM

## 2024-04-18 ENCOUNTER — Emergency Department (HOSPITAL_BASED_OUTPATIENT_CLINIC_OR_DEPARTMENT_OTHER)

## 2024-04-18 ENCOUNTER — Emergency Department (HOSPITAL_BASED_OUTPATIENT_CLINIC_OR_DEPARTMENT_OTHER)
Admission: EM | Admit: 2024-04-18 | Discharge: 2024-04-18 | Disposition: A | Discharge status: Home / Self Care | Attending: Emergency Medicine | Admitting: Emergency Medicine

## 2024-04-18 ENCOUNTER — Encounter (HOSPITAL_BASED_OUTPATIENT_CLINIC_OR_DEPARTMENT_OTHER): Payer: Self-pay

## 2024-04-18 ENCOUNTER — Other Ambulatory Visit: Payer: Self-pay

## 2024-04-18 DIAGNOSIS — F419 Anxiety disorder, unspecified: Secondary | ICD-10-CM | POA: Diagnosis not present

## 2024-04-18 DIAGNOSIS — R55 Syncope and collapse: Secondary | ICD-10-CM | POA: Insufficient documentation

## 2024-04-18 DIAGNOSIS — Z9189 Other specified personal risk factors, not elsewhere classified: Secondary | ICD-10-CM | POA: Insufficient documentation

## 2024-04-18 LAB — COMPREHENSIVE METABOLIC PANEL WITH GFR
ALT: 24 U/L (ref 0–44)
AST: 31 U/L (ref 15–41)
Albumin: 4.5 g/dL (ref 3.5–5.0)
Alkaline Phosphatase: 54 U/L (ref 38–126)
Anion gap: 11 (ref 5–15)
BUN: 16 mg/dL (ref 6–20)
CO2: 24 mmol/L (ref 22–32)
Calcium: 8.9 mg/dL (ref 8.9–10.3)
Chloride: 103 mmol/L (ref 98–111)
Creatinine, Ser: 0.77 mg/dL (ref 0.44–1.00)
GFR, Estimated: >60 mL/min (ref >60)
Glucose, Bld: 86 mg/dL (ref 70–99)
Potassium: 3.5 mmol/L (ref 3.5–5.1)
Sodium: 137 mmol/L (ref 135–145)
Total Bilirubin: <0.2 mg/dL (ref 0.0–1.2)
Total Protein: 7.7 g/dL (ref 6.5–8.1)

## 2024-04-18 LAB — CBC
HCT: 38.3 % (ref 36.0–46.0)
Hemoglobin: 12.7 g/dL (ref 12.0–15.0)
MCH: 30.8 pg (ref 26.0–34.0)
MCHC: 33.2 g/dL (ref 30.0–36.0)
MCV: 93 fL (ref 80.0–100.0)
Platelets: 299 10*3/uL (ref 150–400)
RBC: 4.12 MIL/uL (ref 3.87–5.11)
RDW: 12.7 % (ref 11.5–15.5)
WBC: 6.5 10*3/uL (ref 4.0–10.5)
nRBC: 0 % (ref 0.0–0.2)

## 2024-04-18 LAB — HCG, SERUM, QUALITATIVE: Preg, Serum: NEGATIVE

## 2024-04-18 LAB — CBG MONITORING, ED: Glucose-Capillary: 91 mg/dL (ref 70–99)

## 2024-04-18 MED ORDER — DOXYCYCLINE HYCLATE 100 MG PO CAPS: 100.0000 mg | ORAL_CAPSULE | Freq: Two times a day (BID) | ORAL | 0 refills | Status: DC

## 2024-04-18 NOTE — ED Triage Notes (Signed)
 Reports LOC this morning, dizziness, sweating, and echo in L ear A&O x4 States hit back of head when LOC, denies headaches, emesis, blood thinners

## 2024-04-18 NOTE — ED Provider Notes (Signed)
 [place] [facility] Provider Note   CSN: [id number] Arrival date & time: 04/18/24  8469     Patient presents with: Loss of Consciousness   Kayla Reeves is a 48 y.o. female.    Loss of Consciousness Patient presents after loss consciousness.  States this morning woke up feeling bad.  Was normal yesterday and went to the pool with her child.  States she has a right echoing in her left ear.  Was feeling dizzy.  Had sweating and potentially fevers.  Felt lightheaded and passed out striking the back of her head.  No nausea or vomiting.  No known sick contacts.  However did have a tick on her body that she removed a few days ago.  States there is some redness in the area.     Prior to Admission medications   Medication Sig Start Date End Date Taking? Authorizing Provider  doxycycline  (VIBRAMYCIN ) 100 MG capsule Take 1 capsule (100 mg total) by mouth 2 (two) times daily. 04/18/24  Yes Mozell Arias, MD  fluticasone  (FLONASE ) 50 MCG/ACT nasal spray Place 2 sprays into both nostrils 2 (two) times daily. 10/16/21   Kuneff, Renee A, DO  levocetirizine (XYZAL ) 5 MG tablet Take 1 tablet (5 mg total) by mouth every evening. 10/16/21   Kuneff, Renee A, DO  lisinopril  (ZESTRIL ) 20 MG tablet TAKE 1 TABLET DAILY 05/30/22   Kuneff, Renee A, DO  Multiple Vitamins-Minerals (MULTIVITAMIN ADULT PO) Take 1 tablet by mouth daily.    [provider]  omeprazole  (PRILOSEC) 20 MG capsule Take 1 capsule (20 mg total) by mouth daily. 10/16/21   Kuneff, Renee A, DO  Vitamin D , Cholecalciferol, 50 MCG (2000 UT) CAPS Take by mouth daily.    [provider]    Allergies: Patient has no known allergies.    Review of Systems  Cardiovascular:  Positive for syncope.    Updated Vital Signs BP 128/89   Pulse 61   Temp 97.8 F (36.6 C) (Oral)   Resp 15   Wt 50.3 kg   LMP 04/05/2024 (Exact Date)   SpO2 100%   BMI 20.63 kg/m   Physical  Exam Vitals and nursing note reviewed.  HENT:     Head: Atraumatic.   Eyes:     Extraocular Movements: Extraocular movements intact.     Pupils: Pupils are equal, round, and reactive to light.    Cardiovascular:     Rate and Rhythm: Regular rhythm.  Chest:     Chest wall: No tenderness.  Abdominal:     Tenderness: There is no abdominal tenderness.   Musculoskeletal:     Cervical back: Neck supple.   Skin:    Comments: Redness area around scab on back at site of tick bite.   Neurological:     Mental Status: She is alert.   Psychiatric:     Comments: Patient is anxious and somewhat tearful.     (all labs ordered are listed, but only abnormal results are displayed) Labs Reviewed  COMPREHENSIVE METABOLIC PANEL WITH GFR  CBC  HCG, SERUM, QUALITATIVE  CBG MONITORING, ED    EKG: EKG Interpretation Date/Time:  Sunday April 18 2024 07:45:57 EDT Ventricular Rate:  74 PR Interval:  166 QRS Duration:  92 QT Interval:  395 QTC Calculation: 439 R Axis:   69  Text Interpretation: Sinus rhythm Confirmed by Mozell Arias [phone]) on 04/18/2024 7:48:41 AM  Radiology: Lenell Query Chest Portable 1 View Result Date: 04/18/2024 CLINICAL  DATA:  syncope EXAM: PORTABLE CHEST 1 VIEW COMPARISON:  None Available. FINDINGS: The cardiomediastinal silhouette is normal in contour. No pleural effusion. No pneumothorax. No acute pleuroparenchymal abnormality. IMPRESSION: No acute cardiopulmonary abnormality. Electronically Signed   By: Clancy Crimes M.D.   On: 04/18/2024 08:16     Procedures   Medications Ordered in the ED - No data to display                                  Medical Decision Making Amount and/or Complexity of Data Reviewed Labs: ordered. Radiology: ordered.  Risk Prescription drug management.   Patient felt bad.  Chills.  Had syncopal episode.  Afebrile here.  Does have ringing in ear.  Left TM appears normal.  Lungs clear.  Well-appearing.  Differential diagnose  includes causes such as arrhythmia, dehydration, infection.  With tick bite and infectious type symptoms we will treat with doxycycline  as an outpatient.  EKG reassuring.  Will get x-ray and basic blood work.  Minor impact to head.  Do not think we need head imaging at this time.  Workup reassuring.  No clear cause.  Will treat with doxycycline  for potential tickborne illness with the chills.  Appears stable for discharge home however     Final diagnoses:  Syncope, unspecified syncope type  At high risk for tick borne illness    ED Discharge Orders          Ordered    doxycycline  (VIBRAMYCIN ) 100 MG capsule  2 times daily        04/18/24 0907               Mozell Arias, MD 04/18/24 1437

## 2024-04-20 NOTE — Transitions of Care (Post Inpatient/ED Visit) (Signed)
   04/20/2024  Name: Kayla Reeves MRN: [id number] DOB: 1975/12/25  Today's TOC FU Call Status: Today's TOC FU Call Status:: Unsuccessful Call (1st Attempt) Unsuccessful Call (1st Attempt) Date: 04/20/24  Attempted to reach the patient regarding the most recent Inpatient/ED visit.  Follow Up Plan: Additional outreach attempts will be made to reach the patient to complete the Transitions of Care (Post Inpatient/ED visit) call.   [facility] [place] [facility]

## 2024-06-24 ENCOUNTER — Other Ambulatory Visit: Payer: Self-pay | Admitting: Physician Assistant

## 2024-06-24 DIAGNOSIS — Z1231 Encounter for screening mammogram for malignant neoplasm of breast: Secondary | ICD-10-CM

## 2024-07-06 ENCOUNTER — Other Ambulatory Visit (HOSPITAL_BASED_OUTPATIENT_CLINIC_OR_DEPARTMENT_OTHER): Payer: Self-pay | Admitting: Obstetrics and Gynecology

## 2024-07-06 DIAGNOSIS — Z8262 Family history of osteoporosis: Secondary | ICD-10-CM

## 2024-07-14 ENCOUNTER — Other Ambulatory Visit: Payer: Self-pay | Admitting: Medical Genetics

## 2024-07-21 ENCOUNTER — Ambulatory Visit
Admission: RE | Admit: 2024-07-21 | Discharge: 2024-07-21 | Disposition: A | Discharge status: Home / Self Care | Source: Ambulatory Visit | Attending: Physician Assistant | Admitting: Physician Assistant

## 2024-07-21 DIAGNOSIS — Z1231 Encounter for screening mammogram for malignant neoplasm of breast: Secondary | ICD-10-CM

## 2024-07-26 ENCOUNTER — Other Ambulatory Visit: Payer: Self-pay | Admitting: Physician Assistant

## 2024-07-26 DIAGNOSIS — R928 Other abnormal and inconclusive findings on diagnostic imaging of breast: Secondary | ICD-10-CM

## 2024-07-31 ENCOUNTER — Ambulatory Visit

## 2024-07-31 ENCOUNTER — Ambulatory Visit
Admission: RE | Admit: 2024-07-31 | Discharge: 2024-07-31 | Disposition: A | Discharge status: Home / Self Care | Source: Ambulatory Visit | Attending: Physician Assistant | Admitting: Physician Assistant

## 2024-07-31 DIAGNOSIS — R928 Other abnormal and inconclusive findings on diagnostic imaging of breast: Secondary | ICD-10-CM

## 2024-08-16 NOTE — Progress Notes (Unsigned)
 48 y.o. [id number] Married [place] female here for annual exam. Concerned with having abnormal periods. Periods have recently become abnormal.    Periods started changing for one year.  Periods can come every 25 or 30 days.  No skipped cycles.   Has 2 - 3 days of spotting, 1 or 2 heavy days and then tapers off.    Taking HRT through [facility], video conferencing.   Is treating vaginal dryness and night sweats.   Night sweats are controlled, but vaginal dryness continues.   She has a topical vaginal estrogen, using every other evening.   [facility] provides her Rxs.   PCP: Catherine Fuller A, DO   Patient's last menstrual period was 08/03/2024 (exact date).     Period Cycle (Days): 28 Period Duration (Days): 5-7 Period Pattern: Regular Menstrual Flow: Light, Moderate Menstrual Control: Maxi pad Dysmenorrhea: (!) Moderate Dysmenorrhea Symptoms: Cramping     Sexually active: Yes.    The current method of family planning is vasectomy.    Menopausal hormone therapy:  Estradiol patch & progesterone   Exercising: Yes.    Weight lifting 4x week  Smoker:  no  OB History  Gravida Para Term Preterm AB Living  3 3 3   3   SAB IAB Ectopic Multiple Live Births      3    # Outcome Date GA Lbr Len/2nd Weight Sex Type Anes PTL Lv  3 Term 05/13/01    F Vag-Spont   LIV  2 Term 10/28/96    F Vag-Spont   LIV  1 Term 03/31/93    M Vag-Spont   LIV     HEALTH MAINTENANCE: Last 2 paps:  11/12/18 neg HPV neg, 02/06/15 neg  History of abnormal Pap or positive HPV:  no Mammogram:   07/31/24 Breast Density Cat B, BIRADS Cat 1 neg  Colonoscopy:  never.  Had a positive Cologuard while she was on her period. Bone Density:  n/a  Result  n/a   Immunization History  Administered Date(s) Administered   Influenza,inj,Quad PF,6+ Mos 11/19/2019, 12/01/2020, 10/16/2021   PFIZER(Purple Top)SARS-COV-2 Vaccination 01/29/2020, 02/29/2020   Tdap 11/04/2006, 11/03/2017      reports that she has never smoked. She  has never used smokeless tobacco. She reports current alcohol use of about 1.0 - 2.0 standard drink of alcohol per week. She reports that she does not use drugs.  Past Medical History:  Diagnosis Date   Anxiety    Depression    Hypertension    Seasonal allergies    Urinary incontinence     Past Surgical History:  Procedure Laterality Date   NO PAST SURGERIES      Current Outpatient Medications  Medication Sig Dispense Refill   azelastine (ASTELIN) 0.1 % nasal spray 1 spray.     estradiol (VIVELLE-DOT) 0.05 MG/24HR patch Place 1 patch onto the skin.     fluticasone  (FLONASE ) 50 MCG/ACT nasal spray Place 2 sprays into both nostrils 2 (two) times daily. 16 g 6   levocetirizine (XYZAL ) 5 MG tablet Take 1 tablet (5 mg total) by mouth every evening. 30 tablet 5   Loratadine (CLARITIN PO) Take by mouth as needed.     Multiple Vitamins-Minerals (MULTIVITAMIN ADULT PO) Take 1 tablet by mouth daily.     ondansetron (ZOFRAN-ODT) 4 MG disintegrating tablet TAKE ONE TABLET BY MOUTH EVERY 8 HOURS AS NEEDED FOR UP TO 7 DAYS.     progesterone (PROMETRIUM) 200 MG capsule Take 200 mg by mouth.  tirzepatide (ZEPBOUND) 2.5 MG/0.5ML Pen Subcutaneous; Duration: 84 Days     Vitamin D , Cholecalciferol, 50 MCG (2000 UT) CAPS Take by mouth daily.     No current facility-administered medications for this visit.    ALLERGIES: Patient has no known allergies.  Family History  Problem Relation Age of Onset   Stroke Mother 32   High blood pressure Mother    Rheum arthritis Mother    Hypertension Mother    Mitral valve prolapse Mother    Diabetes Father    Aneurysm Father        brain   Polycythemia Father    Breast cancer Maternal Aunt        mother's half sister   COPD Maternal Grandmother    Stroke Paternal Grandmother    Heart disease Paternal Grandfather    Breast cancer Half-Sister        half sister on father's side    Review of Systems  All other systems reviewed and are  negative.   PHYSICAL EXAM:  BP 118/76 (BP Location: Left Arm, Patient Position: Sitting)   Pulse 73   Ht 5' 2.5 (1.588 m)   Wt 116 lb (52.6 kg)   LMP 08/03/2024 (Exact Date)   SpO2 99%   BMI 20.88 kg/m     General appearance: alert, cooperative and appears stated age Head: normocephalic, without obvious abnormality, atraumatic Neck: no adenopathy, supple, symmetrical, trachea midline and thyroid  normal to inspection and palpation Lungs: clear to auscultation bilaterally Breasts: normal appearance, no masses or tenderness, No nipple retraction or dimpling, No nipple discharge or bleeding, No axillary adenopathy Heart: regular rate and rhythm Abdomen: soft, non-tender; no masses, no organomegaly Extremities: extremities normal, atraumatic, no cyanosis or edema Skin: skin color, texture, turgor normal. No rashes or lesions Lymph nodes: cervical, supraclavicular, and axillary nodes normal. Neurologic: grossly normal  Pelvic: External genitalia:  no lesions              No abnormal inguinal nodes palpated.              Urethra:  normal appearing urethra with no masses, tenderness or lesions              Bartholins and Skenes: normal                 Vagina: normal appearing vagina with normal color and discharge, no lesions              Cervix: no lesions              Pap taken: yes Bimanual Exam:  Uterus:  normal size, contour, position, consistency, mobility, non-tender              Adnexa: no mass, fullness, tenderness              Rectal exam: yes.  Confirms.              Anus:  normal sphincter tone, no lesions  Chaperone was present for exam:  Kari HERO, CMA  ASSESSMENT: Well woman visit with gynecologic exam. Cervical cancer screening.  Perimenopausal symptoms controlled on HRT.  Vaginal atrophy.  Positive Cologuard while on her period.   PHQ-2-9: 0  PLAN: Mammogram screening discussed. Self breast awareness reviewed. Pap and HRV collected:  yes Guidelines for  Calcium, Vitamin D , regular exercise program including cardiovascular and weight bearing exercise. Discused WHI and use of HRT which can increase risk of PE, DVT, MI, stroke and breast cancer.  Medication refills:  Vivelle Dot 0.05 mg twice weekly, Prometrium 200 mg q hs, vaginal estradiol cream.   Labs with PCP. She will follow up with her PCP for referral to GI to discuss colon cancer screening options.  Follow up:  yearly and prn.

## 2024-08-17 ENCOUNTER — Encounter: Payer: Self-pay | Admitting: Obstetrics and Gynecology

## 2024-08-17 ENCOUNTER — Ambulatory Visit (INDEPENDENT_AMBULATORY_CARE_PROVIDER_SITE_OTHER): Admitting: Obstetrics and Gynecology

## 2024-08-17 ENCOUNTER — Other Ambulatory Visit (HOSPITAL_COMMUNITY)
Admission: RE | Admit: 2024-08-17 | Discharge: 2024-08-17 | Disposition: A | Discharge status: Home / Self Care | Source: Ambulatory Visit | Attending: Obstetrics and Gynecology | Admitting: Obstetrics and Gynecology

## 2024-08-17 VITALS — BP 118/76 | HR 73 | Ht 62.5 in | Wt 116.0 lb

## 2024-08-17 DIAGNOSIS — Z01419 Encounter for gynecological examination (general) (routine) without abnormal findings: Secondary | ICD-10-CM | POA: Diagnosis not present

## 2024-08-17 DIAGNOSIS — Z124 Encounter for screening for malignant neoplasm of cervix: Secondary | ICD-10-CM | POA: Diagnosis present

## 2024-08-17 DIAGNOSIS — Z1331 Encounter for screening for depression: Secondary | ICD-10-CM

## 2024-08-17 MED ORDER — ESTRADIOL 0.01 % VA CREA
TOPICAL_CREAM | VAGINAL | 2 refills | Status: AC
Start: 2024-08-17 — End: ?

## 2024-08-17 MED ORDER — ESTRADIOL 0.05 MG/24HR TD PTTW: 1.0000 | MEDICATED_PATCH | TRANSDERMAL | 3 refills | Status: AC

## 2024-08-17 MED ORDER — PROGESTERONE 200 MG PO CAPS: 200.0000 mg | ORAL_CAPSULE | Freq: Every day | ORAL | 3 refills | Status: AC

## 2024-08-17 NOTE — Patient Instructions (Signed)

## 2024-08-18 ENCOUNTER — Ambulatory Visit: Payer: Self-pay | Admitting: Obstetrics and Gynecology

## 2024-08-18 LAB — CYTOLOGY - PAP
Comment: NEGATIVE
Diagnosis: NEGATIVE
High risk HPV: NEGATIVE

## 2024-08-24 ENCOUNTER — Other Ambulatory Visit

## 2024-08-24 DIAGNOSIS — Z006 Encounter for examination for normal comparison and control in clinical research program: Secondary | ICD-10-CM

## 2024-09-10 LAB — GENECONNECT MOLECULAR SCREEN: Genetic Analysis Overall Interpretation: NEGATIVE

## 2024-09-23 ENCOUNTER — Other Ambulatory Visit: Payer: Self-pay

## 2024-09-23 NOTE — Telephone Encounter (Signed)
 Med refill request: vivelle  Last AEX: 08/17/24 Next AEX: n/a Last MMG (if hormonal med) 07/31/24 BIRADS Cat 1 neg  Refill authorized: last rx 08/19/24 #24 with 3 refills. Please advise. Patient called and left VM stating she will be out on Saturday 09/24/24

## 2024-09-27 NOTE — Telephone Encounter (Signed)
 Spoke with Norleen at [facility]. Estradiol  patch RX received and filled on 09/23/24. No additional RX needed.   Routing to Jada FYI.   Encounter closed.

## 2024-10-15 ENCOUNTER — Ambulatory Visit (HOSPITAL_BASED_OUTPATIENT_CLINIC_OR_DEPARTMENT_OTHER)
Admission: RE | Admit: 2024-10-15 | Discharge: 2024-10-15 | Disposition: A | Source: Ambulatory Visit | Attending: Obstetrics and Gynecology | Admitting: Obstetrics and Gynecology

## 2024-10-15 DIAGNOSIS — Z8262 Family history of osteoporosis: Secondary | ICD-10-CM
